# Patient Record
Sex: Female | Born: 1970
Health system: Southern US, Community
[De-identification: ages and names within clinical notes are randomized; demographics above are authoritative.]

## PROBLEM LIST (undated history)

## (undated) DIAGNOSIS — O1420 HELLP syndrome (HELLP), unspecified trimester: Secondary | ICD-10-CM

## (undated) DIAGNOSIS — G43909 Migraine, unspecified, not intractable, without status migrainosus: Secondary | ICD-10-CM

## (undated) DIAGNOSIS — O24419 Gestational diabetes mellitus in pregnancy, unspecified control: Secondary | ICD-10-CM

## (undated) DIAGNOSIS — L409 Psoriasis, unspecified: Secondary | ICD-10-CM

## (undated) DIAGNOSIS — K8020 Calculus of gallbladder without cholecystitis without obstruction: Secondary | ICD-10-CM

## (undated) DIAGNOSIS — O139 Gestational [pregnancy-induced] hypertension without significant proteinuria, unspecified trimester: Secondary | ICD-10-CM

## (undated) HISTORY — PX: CRYOABLATION: SHX1415

## (undated) HISTORY — DX: Migraine, unspecified, not intractable, without status migrainosus: G43.909

## (undated) HISTORY — PX: OTHER SURGICAL HISTORY: SHX169

## (undated) HISTORY — PX: WISDOM TOOTH EXTRACTION: SHX21

## (undated) HISTORY — DX: Gestational diabetes mellitus in pregnancy, unspecified control: O24.419

## (undated) HISTORY — DX: Psoriasis, unspecified: L40.9

## (undated) HISTORY — DX: HELLP syndrome (HELLP), unspecified trimester: O14.20

---

## 2007-05-05 DIAGNOSIS — O1420 HELLP syndrome (HELLP), unspecified trimester: Secondary | ICD-10-CM

## 2007-05-05 HISTORY — DX: HELLP syndrome (HELLP), unspecified trimester: O14.20

## 2012-02-01 LAB — OB RESULTS CONSOLE PLATELET COUNT: Platelets: 238 10*3/uL

## 2012-02-01 LAB — OB RESULTS CONSOLE ABO/RH

## 2012-02-01 LAB — OB RESULTS CONSOLE GC/CHLAMYDIA: Gonorrhea: NEGATIVE

## 2012-02-01 LAB — OB RESULTS CONSOLE HIV ANTIBODY (ROUTINE TESTING): HIV: NONREACTIVE

## 2012-02-01 LAB — OB RESULTS CONSOLE RUBELLA ANTIBODY, IGM: Rubella: IMMUNE

## 2012-06-27 LAB — GLUCOSE, 1 HOUR: Glucose, 1 hour: 160

## 2012-08-01 ENCOUNTER — Encounter: Payer: Self-pay | Admitting: Family Medicine

## 2012-08-01 ENCOUNTER — Other Ambulatory Visit: Payer: Self-pay | Admitting: Family Medicine

## 2012-08-01 ENCOUNTER — Ambulatory Visit (INDEPENDENT_AMBULATORY_CARE_PROVIDER_SITE_OTHER): Payer: BC Managed Care – PPO | Admitting: Family Medicine

## 2012-08-01 ENCOUNTER — Other Ambulatory Visit: Payer: Self-pay | Admitting: Obstetrics & Gynecology

## 2012-08-01 ENCOUNTER — Encounter: Payer: Self-pay | Admitting: *Deleted

## 2012-08-01 VITALS — BP 117/80 | Temp 98.7°F | Ht 67.0 in | Wt 225.7 lb

## 2012-08-01 DIAGNOSIS — O99810 Abnormal glucose complicating pregnancy: Secondary | ICD-10-CM | POA: Insufficient documentation

## 2012-08-01 DIAGNOSIS — O34219 Maternal care for unspecified type scar from previous cesarean delivery: Secondary | ICD-10-CM

## 2012-08-01 DIAGNOSIS — O0990 Supervision of high risk pregnancy, unspecified, unspecified trimester: Secondary | ICD-10-CM | POA: Insufficient documentation

## 2012-08-01 DIAGNOSIS — O09523 Supervision of elderly multigravida, third trimester: Secondary | ICD-10-CM

## 2012-08-01 DIAGNOSIS — O09529 Supervision of elderly multigravida, unspecified trimester: Secondary | ICD-10-CM | POA: Insufficient documentation

## 2012-08-01 DIAGNOSIS — O09299 Supervision of pregnancy with other poor reproductive or obstetric history, unspecified trimester: Secondary | ICD-10-CM

## 2012-08-01 DIAGNOSIS — O09293 Supervision of pregnancy with other poor reproductive or obstetric history, third trimester: Secondary | ICD-10-CM

## 2012-08-01 NOTE — Progress Notes (Signed)
Pulse- 86  Edema-ankles Pain-ligament New ob packet given  Weight gain of 11-20lb

## 2012-08-01 NOTE — Patient Instructions (Signed)
Gestational Diabetes Mellitus Gestational diabetes mellitus (GDM) is diabetes that occurs only during pregnancy. This happens when the body cannot properly handle the glucose (sugar) that increases in the blood after eating. During pregnancy, insulin resistance (reduced sensitivity to insulin) occurs because of the release of hormones from the placenta. Usually, the pancreas of pregnant women produces enough insulin to overcome the resistance that occurs. However, in gestational diabetes, the insulin is there but it does not work effectively. If the resistance is severe enough that the pancreas does not produce enough insulin, extra glucose builds up in the blood.  WHO IS AT RISK FOR DEVELOPING GESTATIONAL DIABETES?  Women with a history of diabetes in the family.  Women over age 25.  Women who are overweight.  Women in certain ethnic groups (Hispanic, African American, Native American, Asian and Pacific Islander). WHAT CAN HAPPEN TO THE BABY? If the mother's blood glucose is too high while she is pregnant, the extra sugar will travel through the umbilical cord to the baby. Some of the problems the baby may have are:  Large Baby - If the baby receives too much sugar, the baby will gain more weight. This may cause the baby to be too large to be born normally (vaginally) and a Cesarean section (C-section) may be needed.  Low Blood Glucose (hypoglycemia)  The baby makes extra insulin, in response to the extra sugar its gets from its mother. When the baby is born and no longer needs this extra insulin, the baby's blood glucose level may drop.  Jaundice (yellow coloring of the skin and eyes)  This is fairly common in babies. It is caused from a build-up of the chemical called bilirubin. This is rarely serious, but is seen more often in babies whose mothers had gestational diabetes. RISKS TO THE MOTHER Women who have had gestational diabetes may be at higher risk for some problems,  including:  Preeclampsia or toxemia, which includes problems with high blood pressure. Blood pressure and protein levels in the urine must be checked frequently.  Infections.  Cesarean section (C-section) for delivery.  Developing Type 2 diabetes later in life. About 30-50% will develop diabetes later, especially if obese. DIAGNOSIS  The hormones that cause insulin resistance are highest at about 24-28 weeks of pregnancy. If symptoms are experienced, they are much like symptoms you would normally expect during pregnancy.  GDM is often diagnosed using a two part method: 1. After 24-28 weeks of pregnancy, the woman drinks a glucose solution and takes a blood test. If the glucose level is high, a second test will be given. 2. Oral Glucose Tolerance Test (OGTT) which is 3 hours long  After not eating overnight, the blood glucose is checked. The woman drinks a glucose solution, and hourly blood glucose tests are taken. If the woman has risk factors for GDM, the caregiver may test earlier than 24 weeks of pregnancy. TREATMENT  Treatment of GDM is directed at keeping the mother's blood glucose level normal, and may include:  Meal planning.  Taking insulin or other medicine to control your blood glucose level.  Exercise.  Keeping a daily record of the foods you eat.  Blood glucose monitoring and keeping a record of your blood glucose levels.  May monitor ketone levels in the urine, although this is no longer considered necessary in most pregnancies. HOME CARE INSTRUCTIONS  While you are pregnant:  Follow your caregiver's advice regarding your prenatal appointments, meal planning, exercise, medicines, vitamins, blood and other tests, and physical   activities.  Keep a record of your meals, blood glucose tests, and the amount of insulin you are taking (if any). Show this to your caregiver at every prenatal visit.  If you have GDM, you may have problems with hypoglycemia (low blood glucose).  You may suspect this if you become suddenly dizzy, feel shaky, and/or weak. If you think this is happening and you have a glucose meter, try to test your blood glucose level. Follow your caregiver's advice for when and how to treat your low blood glucose. Generally, the 15:15 rule is followed: Treat by consuming 15 grams of carbohydrates, wait 15 minutes, and recheck blood glucose. Examples of 15 grams of carbohydrates are:  1 cup skim or low-fat milk.   cup juice.  3-4 glucose tablets.  5-6 hard candies.  1 small box raisins.   cup regular soda pop.  Practice good hygiene, to avoid infections.  Do not smoke. SEEK MEDICAL CARE IF:   You develop abnormal vaginal discharge, with or without itching.  You become weak and tired more than expected.  You seem to sweat a lot.  You have a sudden increase in weight, 5 pounds or more in one week.  You are losing weight, 3 pounds or more in a week.  Your blood glucose level is high, and you need instructions on what to do about it. SEEK IMMEDIATE MEDICAL CARE IF:   You develop a severe headache.  You faint or pass out.  You develop nausea and vomiting.  You become disoriented or confused.  You have a convulsion.  You develop vision problems.  You develop stomach pain.  You develop vaginal bleeding.  You develop uterine contractions.  You have leaking or a gush of fluid from the vagina. AFTER YOU HAVE THE BABY:  Go to all of your follow-up appointments, and have blood tests as advised by your caregiver.  Maintain a healthy lifestyle, to prevent diabetes in the future. This includes:  Following a healthy meal plan.  Controlling your weight.  Getting enough exercise and proper rest.  Do not smoke.  Breastfeed your baby if you can. This will lower the chance of you and your baby developing diabetes later in life. For more information about diabetes, go to the American Diabetes Association at:  PMFashions.com.cy. For more information about gestational diabetes, go to the [employer] at: RentRule.com.au. Document Released: 07/27/2000 Document Revised: 07/13/2011 Document Reviewed: 02/18/2009 [facility] Patient Information 2013 [place], [place].  Preeclampsia and Eclampsia Preeclampsia is a condition of high blood pressure during pregnancy. It can happen at 20 weeks or later in pregnancy. If high blood pressure occurs in the second half of pregnancy with no other symptoms, it is called gestational hypertension and goes away after the baby is born. If any of the symptoms listed below develop with gestational hypertension, it is then called preeclampsia. Eclampsia (convulsions) may follow preeclampsia. This is one of the reasons for regular prenatal checkups. Early diagnosis and treatment are very important to prevent eclampsia. CAUSES  There is no known cause of preeclampsia/eclampsia in pregnancy. There are several known conditions that may put the pregnant woman at risk, such as:  The first pregnancy.  Having preeclampsia in a past pregnancy.  Having lasting (chronic) high blood pressure.  Having multiples (twins, triplets).  Being age 73 or older.  African American ethnic background.  Having kidney disease or diabetes.  Medical conditions such as lupus or blood diseases.  Being overweight (obese). SYMPTOMS  High blood pressure.  Headaches.  Sudden weight gain.  Swelling of hands, face, legs, and feet.  Protein in the urine.  Feeling sick to your stomach (nauseous) and throwing up (vomiting).  Vision problems (blurred or double vision).  Numbness in the face, arms, legs, and feet.  Dizziness.  Slurred speech.  Preeclampsia can cause growth retardation in the fetus.  Separation (abruption) of the placenta.  Not enough fluid in the amniotic sac (oligohydramnios).  Sensitivity to bright lights.  Belly  (abdominal) pain. DIAGNOSIS  If protein is found in the urine in the second half of pregnancy, this is considered preeclampsia. Other symptoms mentioned above may also be present. TREATMENT  It is necessary to treat this.  Your caregiver may prescribe bed rest early in this condition. Plenty of rest and salt restriction may be all that is needed.  Medicines may be necessary to lower blood pressure if the condition does not respond to more conservative measures.  In more severe cases, hospitalization may be needed:  For treatment of blood pressure.  To control fluid retention.  To monitor the baby to see if the condition is causing harm to the baby.  Hospitalization is the best way to treat the first sign of preeclampsia. This is so the mother and baby can be watched closely and blood tests can be done effectively and correctly.  If the condition becomes severe, it may be necessary to induce labor or to remove the infant by surgical means (cesarean section). The best cure for preeclampsia/eclampsia is to deliver the baby. Preeclampsia and eclampsia involve risks to mother and infant. Your caregiver will discuss these risks with you. Together, you can work out the best possible approach to your problems. Make sure you keep your prenatal visits as scheduled. Not keeping appointments could result in a chronic or permanent injury, pain, disability to you, and death or injury to you or your unborn baby. If there is any problem keeping the appointment, you must call to reschedule. HOME CARE INSTRUCTIONS   Keep your prenatal appointments and tests as scheduled.  Tell your caregiver if you have any of the above risk factors.  Get plenty of rest and sleep.  Eat a balanced diet that is low in salt, and do not add salt to your food.  Avoid stressful situations.  Only take over-the-counter and prescriptions medicines for pain, discomfort, or fever as directed by your caregiver. SEEK IMMEDIATE  MEDICAL CARE IF:   You develop severe swelling anywhere in the body. This usually occurs in the legs.  You gain 5 lb/2.3 kg or more in a week.  You develop a severe headache, dizziness, problems with your vision, or confusion.  You have abdominal pain, nausea, or vomiting.  You have a seizure.  You have trouble moving any part of your body, or you develop numbness or problems speaking.  You have bruising or abnormal bleeding from anywhere in the body.  You develop a stiff neck.  You pass out. MAKE SURE YOU:   Understand these instructions.  Will watch your condition.  Will get help right away if you are not doing well or get worse. Document Released: 04/17/2000 Document Revised: 07/13/2011 Document Reviewed: 12/02/2007 [facility] Patient Information 2013 [place], [place].  Pregnancy - Third Trimester The third trimester of pregnancy (the last 3 months) is a period of the most rapid growth for you and your baby. The baby approaches a length of 20 inches and a weight of 6 to 10 pounds. The  baby is adding on fat and getting ready for life outside your body. While inside, babies have periods of sleeping and waking, suck their thumbs, and hiccups. You can often feel small contractions of the uterus. This is false labor. It is also called Braxton-Hicks contractions. This is like a practice for labor. The usual problems in this stage of pregnancy include more difficulty breathing, swelling of the hands and feet from water retention, and having to urinate more often because of the uterus and baby pressing on your bladder.  PRENATAL EXAMS  Blood work may continue to be done during prenatal exams. These tests are done to check on your health and the probable health of your baby. Blood work is used to follow your blood levels (hemoglobin). Anemia (low hemoglobin) is common during pregnancy. Iron and vitamins are given to help prevent this. You may also continue to be checked for diabetes. Some of  the past blood tests may be done again.  The size of the uterus is measured during each visit. This makes sure your baby is growing properly according to your pregnancy dates.  Your blood pressure is checked every prenatal visit. This is to make sure you are not getting toxemia.  Your urine is checked every prenatal visit for infection, diabetes and protein.  Your weight is checked at each visit. This is done to make sure gains are happening at the suggested rate and that you and your baby are growing normally.  Sometimes, an ultrasound is performed to confirm the position and the proper growth and development of the baby. This is a test done that bounces harmless sound waves off the baby so your caregiver can more accurately determine due dates.  Discuss the type of pain medication and anesthesia you will have during your labor and delivery.  Discuss the possibility and anesthesia if a Cesarean Section might be necessary.  Inform your caregiver if there is any mental or physical violence at home. Sometimes, a specialized non-stress test, contraction stress test and biophysical profile are done to make sure the baby is not having a problem. Checking the amniotic fluid surrounding the baby is called an amniocentesis. The amniotic fluid is removed by sticking a needle into the belly (abdomen). This is sometimes done near the end of pregnancy if an early delivery is required. In this case, it is done to help make sure the baby's lungs are mature enough for the baby to live outside of the womb. If the lungs are not mature and it is unsafe to deliver the baby, an injection of cortisone medication is given to the mother 1 to 2 days before the delivery. This helps the baby's lungs mature and makes it safer to deliver the baby. CHANGES OCCURING IN THE THIRD TRIMESTER OF PREGNANCY Your body goes through many changes during pregnancy. They vary from person to person. Talk to your caregiver about changes you  notice and are concerned about.  During the last trimester, you have probably had an increase in your appetite. It is normal to have cravings for certain foods. This varies from person to person and pregnancy to pregnancy.  You may begin to get stretch marks on your hips, abdomen, and breasts. These are normal changes in the body during pregnancy. There are no exercises or medications to take which prevent this change.  Constipation may be treated with a stool softener or adding bulk to your diet. Drinking lots of fluids, fiber in vegetables, fruits, and whole grains are helpful.  Exercising is also helpful. If you have been very active up until your pregnancy, most of these activities can be continued during your pregnancy. If you have been less active, it is helpful to start an exercise program such as walking. Consult your caregiver before starting exercise programs.  Avoid all smoking, alcohol, un-prescribed drugs, herbs and "street drugs" during your pregnancy. These chemicals affect the formation and growth of the baby. Avoid chemicals throughout the pregnancy to ensure the delivery of a healthy infant.  Backache, varicose veins and hemorrhoids may develop or get worse.  You will tire more easily in the third trimester, which is normal.  The baby's movements may be stronger and more often.  You may become short of breath easily.  Your belly button may stick out.  A yellow discharge may leak from your breasts called colostrum.  You may have a bloody mucus discharge. This usually occurs a few days to a week before labor begins. HOME CARE INSTRUCTIONS   Keep your caregiver's appointments. Follow your caregiver's instructions regarding medication use, exercise, and diet.  During pregnancy, you are providing food for you and your baby. Continue to eat regular, well-balanced meals. Choose foods such as meat, fish, milk and other low fat dairy products, vegetables, fruits, and whole-grain  breads and cereals. Your caregiver will tell you of the ideal weight gain.  A physical sexual relationship may be continued throughout pregnancy if there are no other problems such as early (premature) leaking of amniotic fluid from the membranes, vaginal bleeding, or belly (abdominal) pain.  Exercise regularly if there are no restrictions. Check with your caregiver if you are unsure of the safety of your exercises. Greater weight gain will occur in the last 2 trimesters of pregnancy. Exercising helps:  Control your weight.  Get you in shape for labor and delivery.  You lose weight after you deliver.  Rest a lot with legs elevated, or as needed for leg cramps or low back pain.  Wear a good support or jogging bra for breast tenderness during pregnancy. This may help if worn during sleep. Pads or tissues may be used in the bra if you are leaking colostrum.  Do not use hot tubs, steam rooms, or saunas.  Wear your seat belt when driving. This protects you and your baby if you are in an accident.  Avoid raw meat, cat litter boxes and soil used by cats. These carry germs that can cause birth defects in the baby.  It is easier to loose urine during pregnancy. Tightening up and strengthening the pelvic muscles will help with this problem. You can practice stopping your urination while you are going to the bathroom. These are the same muscles you need to strengthen. It is also the muscles you would use if you were trying to stop from passing gas. You can practice tightening these muscles up 10 times a set and repeating this about 3 times per day. Once you know what muscles to tighten up, do not perform these exercises during urination. It is more likely to cause an infection by backing up the urine.  Ask for help if you have financial, counseling or nutritional needs during pregnancy. Your caregiver will be able to offer counseling for these needs as well as refer you for other special needs.  Make  a list of emergency phone numbers and have them available.  Plan on getting help from family or friends when you go home from the hospital.  Make a trial run  to the hospital.  Take prenatal classes with the father to understand, practice and ask questions about the labor and delivery.  Prepare the baby's room/nursery.  Do not travel out of the city unless it is absolutely necessary and with the advice of your caregiver.  Wear only low or no heal shoes to have better balance and prevent falling. MEDICATIONS AND DRUG USE IN PREGNANCY  Take prenatal vitamins as directed. The vitamin should contain 1 milligram of folic acid. Keep all vitamins out of reach of children. Only a couple vitamins or tablets containing iron may be fatal to a baby or young child when ingested.  Avoid use of all medications, including herbs, over-the-counter medications, not prescribed or suggested by your caregiver. Only take over-the-counter or prescription medicines for pain, discomfort, or fever as directed by your caregiver. Do not use aspirin, ibuprofen (Motrin, Advil, Nuprin) or naproxen (Aleve) unless OK'd by your caregiver.  Let your caregiver also know about herbs you may be using.  Alcohol is related to a number of birth defects. This includes fetal alcohol syndrome. All alcohol, in any form, should be avoided completely. Smoking will cause low birth rate and premature babies.  Street/illegal drugs are very harmful to the baby. They are absolutely forbidden. A baby born to an addicted mother will be addicted at birth. The baby will go through the same withdrawal an adult does. SEEK MEDICAL CARE IF: You have any concerns or worries during your pregnancy. It is better to call with your questions if you feel they cannot wait, rather than worry about them. DECISIONS ABOUT CIRCUMCISION You may or may not know the sex of your baby. If you know your baby is a boy, it may be time to think about circumcision.  Circumcision is the removal of the foreskin of the penis. This is the skin that covers the sensitive end of the penis. There is no proven medical need for this. Often this decision is made on what is popular at the time or based upon religious beliefs and social issues. You can discuss these issues with your caregiver or pediatrician. SEEK IMMEDIATE MEDICAL CARE IF:   An unexplained oral temperature above 102 F (38.9 C) develops, or as your caregiver suggests.  You have leaking of fluid from the vagina (birth canal). If leaking membranes are suspected, take your temperature and tell your caregiver of this when you call.  There is vaginal spotting, bleeding or passing clots. Tell your caregiver of the amount and how many pads are used.  You develop a bad smelling vaginal discharge with a change in the color from clear to white.  You develop vomiting that lasts more than 24 hours.  You develop chills or fever.  You develop shortness of breath.  You develop burning on urination.  You loose more than 2 pounds of weight or gain more than 2 pounds of weight or as suggested by your caregiver.  You notice sudden swelling of your face, hands, and feet or legs.  You develop belly (abdominal) pain. Round ligament discomfort is a common non-cancerous (benign) cause of abdominal pain in pregnancy. Your caregiver still must evaluate you.  You develop a severe headache that does not go away.  You develop visual problems, blurred or double vision.  If you have not felt your baby move for more than 1 hour. If you think the baby is not moving as much as usual, eat something with sugar in it and lie down on your left side  for an hour. The baby should move at least 4 to 5 times per hour. Call right away if your baby moves less than that.  You fall, are in a car accident or any kind of trauma.  There is mental or physical violence at home. Document Released: 04/14/2001 Document Revised: 07/13/2011  Document Reviewed: 10/17/2008 [facility] Patient Information 2013 [place], [place].  Breastfeeding Deciding to breastfeed is one of the best choices you can make for you and your baby. The information that follows gives a brief overview of the benefits of breastfeeding as well as common topics surrounding breastfeeding. BENEFITS OF BREASTFEEDING For the baby  The first milk (colostrum) helps the baby's digestive system function better.   There are antibodies in the mother's milk that help the baby fight off infections.   The baby has a lower incidence of asthma, allergies, and sudden infant death syndrome (SIDS).   The nutrients in breast milk are better for the baby than infant formulas, and breast milk helps the baby's brain grow better.   Babies who breastfeed have less gas, colic, and constipation.  For the mother  Breastfeeding helps develop a very special bond between the mother and her baby.   Breastfeeding is convenient, always available at the correct temperature, and costs nothing.   Breastfeeding burns calories in the mother and helps her lose weight that was gained during pregnancy.   Breastfeeding makes the uterus contract back down to normal size faster and slows bleeding following delivery.   Breastfeeding mothers have a lower risk of developing breast cancer.  BREASTFEEDING FREQUENCY  A healthy, full-term baby may breastfeed as often as every hour or space his or her feedings to every 3 hours.   Watch your baby for signs of hunger. Nurse your baby if he or she shows signs of hunger. How often you nurse will vary from baby to baby.   Nurse as often as the baby requests, or when you feel the need to reduce the fullness of your breasts.   Awaken the baby if it has been 3 4 hours since the last feeding.   Frequent feeding will help the mother make more milk and will help prevent problems, such as sore nipples and engorgement of the breasts.  BABY'S  POSITION AT THE BREAST  Whether lying down or sitting, be sure that the baby's tummy is facing your tummy.   Support the breast with 4 fingers underneath the breast and the thumb above. Make sure your fingers are well away from the nipple and baby's mouth.   Stroke the baby's lips gently with your finger or nipple.   When the baby's mouth is open wide enough, place all of your nipple and as much of the areola as possible into your baby's mouth.   Pull the baby in close so the tip of the nose and the baby's cheeks touch the breast during the feeding.  FEEDINGS AND SUCTION  The length of each feeding varies from baby to baby and from feeding to feeding.   The baby must suck about 2 3 minutes for your milk to get to him or her. This is called a "let down." For this reason, allow the baby to feed on each breast as long as he or she wants. Your baby will end the feeding when he or she has received the right balance of nutrients.   To break the suction, put your finger into the corner of the baby's mouth and slide it between his  or her gums before removing your breast from his or her mouth. This will help prevent sore nipples.  HOW TO TELL WHETHER YOUR BABY IS GETTING ENOUGH BREAST MILK. Wondering whether or not your baby is getting enough milk is a common concern among mothers. You can be assured that your baby is getting enough milk if:   Your baby is actively sucking and you hear swallowing.   Your baby seems relaxed and satisfied after a feeding.   Your baby nurses at least 8 12 times in a 24 hour time period. Nurse your baby until he or she unlatches or falls asleep at the first breast (at least 10 20 minutes), then offer the second side.   Your baby is wetting 5 6 disposable diapers (6 8 cloth diapers) in a 24 hour period by 56 54 days of age.   Your baby is having at least 3 4 stools every 24 hours for the first 6 weeks. The stool should be soft and yellow.   Your baby  should gain 4 7 ounces per week after he or she is 44 days old.   Your breasts feel softer after nursing.  REDUCING BREAST ENGORGEMENT  In the first week after your baby is born, you may experience signs of breast engorgement. When breasts are engorged, they feel heavy, warm, full, and may be tender to the touch. You can reduce engorgement if you:   Nurse frequently, every 2 3 hours. Mothers who breastfeed early and often have fewer problems with engorgement.   Place light ice packs on your breasts for 10 20 minutes between feedings. This reduces swelling. Wrap the ice packs in a lightweight towel to protect your skin. Bags of frozen vegetables work well for this purpose.   Take a warm shower or apply warm, moist heat to your breast for 5 10 minutes just before each feeding. This increases circulation and helps the milk flow.   Gently massage your breast before and during the feeding. Using your finger tips, massage from the chest wall towards your nipple in a circular motion.   Make sure that the baby empties at least one breast at every feeding before switching sides.   Use a breast pump to empty the breasts if your baby is sleepy or not nursing well. You may also want to pump if you are returning to work oryou feel you are getting engorged.   Avoid bottle feeds, pacifiers, or supplemental feedings of water or juice in place of breastfeeding. Breast milk is all the food your baby needs. It is not necessary for your baby to have water or formula. In fact, to help your breasts make more milk, it is best not to give your baby supplemental feedings during the early weeks.   Be sure the baby is latched on and positioned properly while breastfeeding.   Wear a supportive bra, avoiding underwire styles.   Eat a balanced diet with enough fluids.   Rest often, relax, and take your prenatal vitamins to prevent fatigue, stress, and anemia.  If you follow these suggestions, your  engorgement should improve in 24 48 hours. If you are still experiencing difficulty, call your lactation consultant or caregiver.  CARING FOR YOURSELF Take care of your breasts  Bathe or shower daily.   Avoid using soap on your nipples.   Start feedings on your left breast at one feeding and on your right breast at the next feeding.   You will notice an increase in your  milk supply 2 5 days after delivery. You may feel some discomfort from engorgement, which makes your breasts very firm and often tender. Engorgement "peaks" out within 24 48 hours. In the meantime, apply warm moist towels to your breasts for 5 10 minutes before feeding. Gentle massage and expression of some milk before feeding will soften your breasts, making it easier for your baby to latch on.   Wear a well-fitting nursing bra, and air dry your nipples for a 3 after each feeding.   Only use cotton bra pads.   Only use pure lanolin on your nipples after nursing. You do not need to wash it off before feeding the baby again. Another option is to express a few drops of breast milk and gently massage it into your nipples.  Take care of yourself  Eat well-balanced meals and nutritious snacks.   Drinking milk, fruit juice, and water to satisfy your thirst (about 8 glasses a day).   Get plenty of rest.  Avoid foods that you notice affect the baby in a bad way.  SEEK MEDICAL CARE IF:   You have difficulty with breastfeeding and need help.   You have a hard, red, sore area on your breast that is accompanied by a fever.   Your baby is too sleepy to eat well or is having trouble sleeping.   Your baby is wetting less than 6 diapers a day, by 51 days of age.   Your baby's skin or white part of his or her eyes is more yellow than it was in the hospital.   You feel depressed.  Document Released: 04/20/2005 Document Revised: 10/20/2011 Document Reviewed: 07/19/2011 [facility] Patient Information 2013  [place], [place].

## 2012-08-01 NOTE — Progress Notes (Signed)
[id number] 1 out of range 2 hr pp 94-140--mostly out after am meal--needs to add protein.  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 Transfer of care from [place].  Had HELLP and C-section and GA secondary to low plts last pregnancy.  Desires RCS and BTL.

## 2012-08-15 ENCOUNTER — Ambulatory Visit (INDEPENDENT_AMBULATORY_CARE_PROVIDER_SITE_OTHER): Payer: BC Managed Care – PPO | Admitting: Family Medicine

## 2012-08-15 VITALS — BP 105/72 | Temp 96.7°F | Wt 226.0 lb

## 2012-08-15 DIAGNOSIS — O0990 Supervision of high risk pregnancy, unspecified, unspecified trimester: Secondary | ICD-10-CM

## 2012-08-15 DIAGNOSIS — O99810 Abnormal glucose complicating pregnancy: Secondary | ICD-10-CM

## 2012-08-15 LAB — POCT URINALYSIS DIP (DEVICE)
Glucose, UA: NEGATIVE mg/dL
Leukocytes, UA: NEGATIVE
Specific Gravity, Urine: >=1.03 (ref 1.005–1.030)
Urobilinogen, UA: 1 mg/dL (ref 0.0–1.0)

## 2012-08-15 NOTE — Progress Notes (Signed)
Pulse  95  Edema trace in ankles.

## 2012-08-15 NOTE — Progress Notes (Signed)
Fasting:  60-89, PP 75-142 (6/18 out of range, most in low 120s). Varicosity in left leg goes up to thigh -  Left calf larger than right but negative Homan's sign, no swelling, redness, pain.  C/s scheduled 5/21, wants BTL ([employer] so no papers needed).

## 2012-08-15 NOTE — Patient Instructions (Addendum)
Pregnancy - Third Trimester  The third trimester of pregnancy (the last 3 months) is a period of the most rapid growth for you and your baby. The baby approaches a length of 20 inches and a weight of 6 to 10 pounds. The baby is adding on fat and getting ready for life outside your body. While inside, babies have periods of sleeping and waking, suck their thumbs, and hiccups. You can often feel small contractions of the uterus. This is false labor. It is also called Braxton-Hicks contractions. This is like a practice for labor. The usual problems in this stage of pregnancy include more difficulty breathing, swelling of the hands and feet from water retention, and having to urinate more often because of the uterus and baby pressing on your bladder.   PRENATAL EXAMS  · Blood work may continue to be done during prenatal exams. These tests are done to check on your health and the probable health of your baby. Blood work is used to follow your blood levels (hemoglobin). Anemia (low hemoglobin) is common during pregnancy. Iron and vitamins are given to help prevent this. You may also continue to be checked for diabetes. Some of the past blood tests may be done again.  · The size of the uterus is measured during each visit. This makes sure your baby is growing properly according to your pregnancy dates.  · Your blood pressure is checked every prenatal visit. This is to make sure you are not getting toxemia.  · Your urine is checked every prenatal visit for infection, diabetes and protein.  · Your weight is checked at each visit. This is done to make sure gains are happening at the suggested rate and that you and your baby are growing normally.  · Sometimes, an ultrasound is performed to confirm the position and the proper growth and development of the baby. This is a test done that bounces harmless sound waves off the baby so your caregiver can more accurately determine due dates.  · Discuss the type of pain medication and  anesthesia you will have during your labor and delivery.  · Discuss the possibility and anesthesia if a Cesarean Section might be necessary.  · Inform your caregiver if there is any mental or physical violence at home.  Sometimes, a specialized non-stress test, contraction stress test and biophysical profile are done to make sure the baby is not having a problem. Checking the amniotic fluid surrounding the baby is called an amniocentesis. The amniotic fluid is removed by sticking a needle into the belly (abdomen). This is sometimes done near the end of pregnancy if an early delivery is required. In this case, it is done to help make sure the baby's lungs are mature enough for the baby to live outside of the womb. If the lungs are not mature and it is unsafe to deliver the baby, an injection of cortisone medication is given to the mother 1 to 2 days before the delivery. This helps the baby's lungs mature and makes it safer to deliver the baby.  CHANGES OCCURING IN THE THIRD TRIMESTER OF PREGNANCY  Your body goes through many changes during pregnancy. They vary from person to person. Talk to your caregiver about changes you notice and are concerned about.  · During the last trimester, you have probably had an increase in your appetite. It is normal to have cravings for certain foods. This varies from person to person and pregnancy to pregnancy.  · You may begin to   get stretch marks on your hips, abdomen, and breasts. These are normal changes in the body during pregnancy. There are no exercises or medications to take which prevent this change.  · Constipation may be treated with a stool softener or adding bulk to your diet. Drinking lots of fluids, fiber in vegetables, fruits, and whole grains are helpful.  · Exercising is also helpful. If you have been very active up until your pregnancy, most of these activities can be continued during your pregnancy. If you have been less active, it is helpful to start an exercise  program such as walking. Consult your caregiver before starting exercise programs.  · Avoid all smoking, alcohol, un-prescribed drugs, herbs and "street drugs" during your pregnancy. These chemicals affect the formation and growth of the baby. Avoid chemicals throughout the pregnancy to ensure the delivery of a healthy infant.  · Backache, varicose veins and hemorrhoids may develop or get worse.  · You will tire more easily in the third trimester, which is normal.  · The baby's movements may be stronger and more often.  · You may become short of breath easily.  · Your belly button may stick out.  · A yellow discharge may leak from your breasts called colostrum.  · You may have a bloody mucus discharge. This usually occurs a few days to a week before labor begins.  HOME CARE INSTRUCTIONS   · Keep your caregiver's appointments. Follow your caregiver's instructions regarding medication use, exercise, and diet.  · During pregnancy, you are providing food for you and your baby. Continue to eat regular, well-balanced meals. Choose foods such as meat, fish, milk and other low fat dairy products, vegetables, fruits, and whole-grain breads and cereals. Your caregiver will tell you of the ideal weight gain.  · A physical sexual relationship may be continued throughout pregnancy if there are no other problems such as early (premature) leaking of amniotic fluid from the membranes, vaginal bleeding, or belly (abdominal) pain.  · Exercise regularly if there are no restrictions. Check with your caregiver if you are unsure of the safety of your exercises. Greater weight gain will occur in the last 2 trimesters of pregnancy. Exercising helps:  · Control your weight.  · Get you in shape for labor and delivery.  · You lose weight after you deliver.  · Rest a lot with legs elevated, or as needed for leg cramps or low back pain.  · Wear a good support or jogging bra for breast tenderness during pregnancy. This may help if worn during  sleep. Pads or tissues may be used in the bra if you are leaking colostrum.  · Do not use hot tubs, steam rooms, or saunas.  · Wear your seat belt when driving. This protects you and your baby if you are in an accident.  · Avoid raw meat, cat litter boxes and soil used by cats. These carry germs that can cause birth defects in the baby.  · It is easier to loose urine during pregnancy. Tightening up and strengthening the pelvic muscles will help with this problem. You can practice stopping your urination while you are going to the bathroom. These are the same muscles you need to strengthen. It is also the muscles you would use if you were trying to stop from passing gas. You can practice tightening these muscles up 10 times a set and repeating this about 3 times per day. Once you know what muscles to tighten up, do not perform these   exercises during urination. It is more likely to cause an infection by backing up the urine.  · Ask for help if you have financial, counseling or nutritional needs during pregnancy. Your caregiver will be able to offer counseling for these needs as well as refer you for other special needs.  · Make a list of emergency phone numbers and have them available.  · Plan on getting help from family or friends when you go home from the hospital.  · Make a trial run to the hospital.  · Take prenatal classes with the father to understand, practice and ask questions about the labor and delivery.  · Prepare the baby's room/nursery.  · Do not travel out of the city unless it is absolutely necessary and with the advice of your caregiver.  · Wear only low or no heal shoes to have better balance and prevent falling.  MEDICATIONS AND DRUG USE IN PREGNANCY  · Take prenatal vitamins as directed. The vitamin should contain 1 milligram of folic acid. Keep all vitamins out of reach of children. Only a couple vitamins or tablets containing iron may be fatal to a baby or young child when ingested.  · Avoid use  of all medications, including herbs, over-the-counter medications, not prescribed or suggested by your caregiver. Only take over-the-counter or prescription medicines for pain, discomfort, or fever as directed by your caregiver. Do not use aspirin, ibuprofen (Motrin®, Advil®, Nuprin®) or naproxen (Aleve®) unless OK'd by your caregiver.  · Let your caregiver also know about herbs you may be using.  · Alcohol is related to a number of birth defects. This includes fetal alcohol syndrome. All alcohol, in any form, should be avoided completely. Smoking will cause low birth rate and premature babies.  · Street/illegal drugs are very harmful to the baby. They are absolutely forbidden. A baby born to an addicted mother will be addicted at birth. The baby will go through the same withdrawal an adult does.  SEEK MEDICAL CARE IF:  You have any concerns or worries during your pregnancy. It is better to call with your questions if you feel they cannot wait, rather than worry about them.  DECISIONS ABOUT CIRCUMCISION  You may or may not know the sex of your baby. If you know your baby is a boy, it may be time to think about circumcision. Circumcision is the removal of the foreskin of the penis. This is the skin that covers the sensitive end of the penis. There is no proven medical need for this. Often this decision is made on what is popular at the time or based upon religious beliefs and social issues. You can discuss these issues with your caregiver or pediatrician.  SEEK IMMEDIATE MEDICAL CARE IF:   · An unexplained oral temperature above 102° F (38.9° C) develops, or as your caregiver suggests.  · You have leaking of fluid from the vagina (birth canal). If leaking membranes are suspected, take your temperature and tell your caregiver of this when you call.  · There is vaginal spotting, bleeding or passing clots. Tell your caregiver of the amount and how many pads are used.  · You develop a bad smelling vaginal discharge with  a change in the color from clear to white.  · You develop vomiting that lasts more than 24 hours.  · You develop chills or fever.  · You develop shortness of breath.  · You develop burning on urination.  · You loose more than 2 pounds of weight   or gain more than 2 pounds of weight or as suggested by your caregiver.  · You notice sudden swelling of your face, hands, and feet or legs.  · You develop belly (abdominal) pain. Round ligament discomfort is a common non-cancerous (benign) cause of abdominal pain in pregnancy. Your caregiver still must evaluate you.  · You develop a severe headache that does not go away.  · You develop visual problems, blurred or double vision.  · If you have not felt your baby move for more than 1 hour. If you think the baby is not moving as much as usual, eat something with sugar in it and lie down on your left side for an hour. The baby should move at least 4 to 5 times per hour. Call right away if your baby moves less than that.  · You fall, are in a car accident or any kind of trauma.  · There is mental or physical violence at home.  Document Released: 04/14/2001 Document Revised: 07/13/2011 Document Reviewed: 10/17/2008  [employer]® Patient Information ©2013 [employer].

## 2012-08-16 ENCOUNTER — Telehealth: Payer: Self-pay | Admitting: *Deleted

## 2012-08-16 NOTE — Telephone Encounter (Addendum)
Pt left message stating that she would like to see a doctor at her next appt on 4/28. She is currently scheduled to see Deirdre Poe CNM. Her C/S will be next month and she wants to meet as many doctors as possible. She also has concerns about her urinalysis from yesterday because it showed some bilirubin. She had pre-eclampsia and HELLP with a previous pregnancy and is requesting to have lab work done to check on this condition. Specifically she is asking for CBC and liver function tests. I called pt and left message that I will call again later today.  **Pt should be informed that she may meet the doctors that are in clinic on 4/28 even though her appt will still be with Deirdre Poe CNM. Her c/s is scheduled to be performed by Dr. Debroah Loop on 5/21. He will be in [facility] on 5/5 and 5/12- she may be scheduled to see him on either of those days. The bilirubin in her urine is of no great concern and was due to her being dehydrated. She should increase her po fluid intake. Also, she is not currently exhibiting any signs of pre-eclampsia as her BP is normal and she has not had protein in her urine. Therefore, labs for this are not indicated at this time. She may discuss further with Deirdre at her visit on 4/28.  1402-  Pt called back and left message that she has dental appt today but will be available tomorrow morning. I called her back immediately and got voice mail again. I left message stating that I have answers to all of her questions and concerns. If I do not speak with her today, I will call her tomorrow morning.  4/16  1100- called pt and left message for her to call back and ask to speak with me today.  4/21  1210- spoke w/pt and assured her that she can meet some of the doctors at her next visit on 4/28. Her prenatal visit will still be with Deirdre Poe CNM as scheduled. She can schedule follow up appt w/Dr. Debroah Loop on 5/5 or 5/12 so that she will get to meet him prior to her scheduled C/S. She expressed concern  that she has a high risk pregnancy and since she is scheduled to see a nurse and not seeing a doctor, she thinks that her care is not any different than it was at the previous practice  in [place]. I assured pt that Deirdre is a Statistician with much advanced training and knowledge than a nurse and she has years of experience caring for women with high risk pregnancies. Pt asked about the bilirubin in her last urine specimen. I stated that she was dehydrated at the time and that can sometimes make a difference. Pt is very anxious about having pre-eclampsia and HELLP as with previous pregnancy and stated that she hopes she can get blood work to check her platelet count and liver function. I stated that bilirubin is not an indication of pre-eclampsia and that she did not have any protein in her urine specimen. I advised her to discuss her concern with Deirdre. Pt agreed, voiced understanding and was satisfied with all information given.

## 2012-08-29 ENCOUNTER — Ambulatory Visit (INDEPENDENT_AMBULATORY_CARE_PROVIDER_SITE_OTHER): Payer: BC Managed Care – PPO | Admitting: Obstetrics and Gynecology

## 2012-08-29 ENCOUNTER — Encounter: Payer: Self-pay | Admitting: Obstetrics and Gynecology

## 2012-08-29 VITALS — BP 130/76 | Temp 97.0°F | Wt 231.3 lb

## 2012-08-29 DIAGNOSIS — Z23 Encounter for immunization: Secondary | ICD-10-CM

## 2012-08-29 DIAGNOSIS — O09299 Supervision of pregnancy with other poor reproductive or obstetric history, unspecified trimester: Secondary | ICD-10-CM

## 2012-08-29 LAB — COMPREHENSIVE METABOLIC PANEL
AST: 7 U/L (ref 0–37)
BUN: 3 mg/dL — ABNORMAL LOW (ref 6–23)
Calcium: 8.6 mg/dL (ref 8.4–10.5)
Chloride: 107 mEq/L (ref 96–112)
Creat: 0.49 mg/dL — ABNORMAL LOW (ref 0.50–1.10)

## 2012-08-29 LAB — POCT URINALYSIS DIP (DEVICE)
Bilirubin Urine: NEGATIVE
Glucose, UA: NEGATIVE mg/dL
Hgb urine dipstick: NEGATIVE
Nitrite: NEGATIVE

## 2012-08-29 LAB — CBC
HCT: 32.6 % — ABNORMAL LOW (ref 36.0–46.0)
Hemoglobin: 11.4 g/dL — ABNORMAL LOW (ref 12.0–15.0)
MCHC: 35 g/dL (ref 30.0–36.0)

## 2012-08-29 MED ORDER — TETANUS-DIPHTH-ACELL PERTUSSIS 5-2.5-18.5 LF-MCG/0.5 IM SUSP
0.5000 mL | Freq: Once | INTRAMUSCULAR | Status: AC
  Administered 2012-08-29: 0.5 mL via INTRAMUSCULAR

## 2012-08-29 NOTE — Progress Notes (Signed)
CBGs all within range except occ 130's 140 when she ate cake at baby showers. 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 tDAP today.

## 2012-08-29 NOTE — Patient Instructions (Signed)
Pregnancy - Third Trimester  The third trimester of pregnancy (the last 3 months) is a period of the most rapid growth for you and your baby. The baby approaches a length of 20 inches and a weight of 6 to 10 pounds. The baby is adding on fat and getting ready for life outside your body. While inside, babies have periods of sleeping and waking, suck their thumbs, and hiccups. You can often feel small contractions of the uterus. This is false labor. It is also called Braxton-Hicks contractions. This is like a practice for labor. The usual problems in this stage of pregnancy include more difficulty breathing, swelling of the hands and feet from water retention, and having to urinate more often because of the uterus and baby pressing on your bladder.   PRENATAL EXAMS  · Blood work may continue to be done during prenatal exams. These tests are done to check on your health and the probable health of your baby. Blood work is used to follow your blood levels (hemoglobin). Anemia (low hemoglobin) is common during pregnancy. Iron and vitamins are given to help prevent this. You may also continue to be checked for diabetes. Some of the past blood tests may be done again.  · The size of the uterus is measured during each visit. This makes sure your baby is growing properly according to your pregnancy dates.  · Your blood pressure is checked every prenatal visit. This is to make sure you are not getting toxemia.  · Your urine is checked every prenatal visit for infection, diabetes and protein.  · Your weight is checked at each visit. This is done to make sure gains are happening at the suggested rate and that you and your baby are growing normally.  · Sometimes, an ultrasound is performed to confirm the position and the proper growth and development of the baby. This is a test done that bounces harmless sound waves off the baby so your caregiver can more accurately determine due dates.  · Discuss the type of pain medication and  anesthesia you will have during your labor and delivery.  · Discuss the possibility and anesthesia if a Cesarean Section might be necessary.  · Inform your caregiver if there is any mental or physical violence at home.  Sometimes, a specialized non-stress test, contraction stress test and biophysical profile are done to make sure the baby is not having a problem. Checking the amniotic fluid surrounding the baby is called an amniocentesis. The amniotic fluid is removed by sticking a needle into the belly (abdomen). This is sometimes done near the end of pregnancy if an early delivery is required. In this case, it is done to help make sure the baby's lungs are mature enough for the baby to live outside of the womb. If the lungs are not mature and it is unsafe to deliver the baby, an injection of cortisone medication is given to the mother 1 to 2 days before the delivery. This helps the baby's lungs mature and makes it safer to deliver the baby.  CHANGES OCCURING IN THE THIRD TRIMESTER OF PREGNANCY  Your body goes through many changes during pregnancy. They vary from person to person. Talk to your caregiver about changes you notice and are concerned about.  · During the last trimester, you have probably had an increase in your appetite. It is normal to have cravings for certain foods. This varies from person to person and pregnancy to pregnancy.  · You may begin to   get stretch marks on your hips, abdomen, and breasts. These are normal changes in the body during pregnancy. There are no exercises or medications to take which prevent this change.  · Constipation may be treated with a stool softener or adding bulk to your diet. Drinking lots of fluids, fiber in vegetables, fruits, and whole grains are helpful.  · Exercising is also helpful. If you have been very active up until your pregnancy, most of these activities can be continued during your pregnancy. If you have been less active, it is helpful to start an exercise  program such as walking. Consult your caregiver before starting exercise programs.  · Avoid all smoking, alcohol, un-prescribed drugs, herbs and "street drugs" during your pregnancy. These chemicals affect the formation and growth of the baby. Avoid chemicals throughout the pregnancy to ensure the delivery of a healthy infant.  · Backache, varicose veins and hemorrhoids may develop or get worse.  · You will tire more easily in the third trimester, which is normal.  · The baby's movements may be stronger and more often.  · You may become short of breath easily.  · Your belly button may stick out.  · A yellow discharge may leak from your breasts called colostrum.  · You may have a bloody mucus discharge. This usually occurs a few days to a week before labor begins.  HOME CARE INSTRUCTIONS   · Keep your caregiver's appointments. Follow your caregiver's instructions regarding medication use, exercise, and diet.  · During pregnancy, you are providing food for you and your baby. Continue to eat regular, well-balanced meals. Choose foods such as meat, fish, milk and other low fat dairy products, vegetables, fruits, and whole-grain breads and cereals. Your caregiver will tell you of the ideal weight gain.  · A physical sexual relationship may be continued throughout pregnancy if there are no other problems such as early (premature) leaking of amniotic fluid from the membranes, vaginal bleeding, or belly (abdominal) pain.  · Exercise regularly if there are no restrictions. Check with your caregiver if you are unsure of the safety of your exercises. Greater weight gain will occur in the last 2 trimesters of pregnancy. Exercising helps:  · Control your weight.  · Get you in shape for labor and delivery.  · You lose weight after you deliver.  · Rest a lot with legs elevated, or as needed for leg cramps or low back pain.  · Wear a good support or jogging bra for breast tenderness during pregnancy. This may help if worn during  sleep. Pads or tissues may be used in the bra if you are leaking colostrum.  · Do not use hot tubs, steam rooms, or saunas.  · Wear your seat belt when driving. This protects you and your baby if you are in an accident.  · Avoid raw meat, cat litter boxes and soil used by cats. These carry germs that can cause birth defects in the baby.  · It is easier to loose urine during pregnancy. Tightening up and strengthening the pelvic muscles will help with this problem. You can practice stopping your urination while you are going to the bathroom. These are the same muscles you need to strengthen. It is also the muscles you would use if you were trying to stop from passing gas. You can practice tightening these muscles up 10 times a set and repeating this about 3 times per day. Once you know what muscles to tighten up, do not perform these   exercises during urination. It is more likely to cause an infection by backing up the urine.  · Ask for help if you have financial, counseling or nutritional needs during pregnancy. Your caregiver will be able to offer counseling for these needs as well as refer you for other special needs.  · Make a list of emergency phone numbers and have them available.  · Plan on getting help from family or friends when you go home from the hospital.  · Make a trial run to the hospital.  · Take prenatal classes with the father to understand, practice and ask questions about the labor and delivery.  · Prepare the baby's room/nursery.  · Do not travel out of the city unless it is absolutely necessary and with the advice of your caregiver.  · Wear only low or no heal shoes to have better balance and prevent falling.  MEDICATIONS AND DRUG USE IN PREGNANCY  · Take prenatal vitamins as directed. The vitamin should contain 1 milligram of folic acid. Keep all vitamins out of reach of children. Only a couple vitamins or tablets containing iron may be fatal to a baby or young child when ingested.  · Avoid use  of all medications, including herbs, over-the-counter medications, not prescribed or suggested by your caregiver. Only take over-the-counter or prescription medicines for pain, discomfort, or fever as directed by your caregiver. Do not use aspirin, ibuprofen (Motrin®, Advil®, Nuprin®) or naproxen (Aleve®) unless OK'd by your caregiver.  · Let your caregiver also know about herbs you may be using.  · Alcohol is related to a number of birth defects. This includes fetal alcohol syndrome. All alcohol, in any form, should be avoided completely. Smoking will cause low birth rate and premature babies.  · Street/illegal drugs are very harmful to the baby. They are absolutely forbidden. A baby born to an addicted mother will be addicted at birth. The baby will go through the same withdrawal an adult does.  SEEK MEDICAL CARE IF:  You have any concerns or worries during your pregnancy. It is better to call with your questions if you feel they cannot wait, rather than worry about them.  DECISIONS ABOUT CIRCUMCISION  You may or may not know the sex of your baby. If you know your baby is a boy, it may be time to think about circumcision. Circumcision is the removal of the foreskin of the penis. This is the skin that covers the sensitive end of the penis. There is no proven medical need for this. Often this decision is made on what is popular at the time or based upon religious beliefs and social issues. You can discuss these issues with your caregiver or pediatrician.  SEEK IMMEDIATE MEDICAL CARE IF:   · An unexplained oral temperature above 102° F (38.9° C) develops, or as your caregiver suggests.  · You have leaking of fluid from the vagina (birth canal). If leaking membranes are suspected, take your temperature and tell your caregiver of this when you call.  · There is vaginal spotting, bleeding or passing clots. Tell your caregiver of the amount and how many pads are used.  · You develop a bad smelling vaginal discharge with  a change in the color from clear to white.  · You develop vomiting that lasts more than 24 hours.  · You develop chills or fever.  · You develop shortness of breath.  · You develop burning on urination.  · You loose more than 2 pounds of weight   or gain more than 2 pounds of weight or as suggested by your caregiver.  · You notice sudden swelling of your face, hands, and feet or legs.  · You develop belly (abdominal) pain. Round ligament discomfort is a common non-cancerous (benign) cause of abdominal pain in pregnancy. Your caregiver still must evaluate you.  · You develop a severe headache that does not go away.  · You develop visual problems, blurred or double vision.  · If you have not felt your baby move for more than 1 hour. If you think the baby is not moving as much as usual, eat something with sugar in it and lie down on your left side for an hour. The baby should move at least 4 to 5 times per hour. Call right away if your baby moves less than that.  · You fall, are in a car accident or any kind of trauma.  · There is mental or physical violence at home.  Document Released: 04/14/2001 Document Revised: 07/13/2011 Document Reviewed: 10/17/2008  Exit[employer]® Patient Information ©2013 [employer].

## 2012-08-29 NOTE — Progress Notes (Signed)
Patient concerned about rapid weight gain.

## 2012-08-29 NOTE — Addendum Note (Signed)
Addended by: Toula Moos on: 08/29/2012 11:51 AM   Modules accepted: Orders

## 2012-08-29 NOTE — Addendum Note (Signed)
Addended by: Franchot Mimes on: 08/29/2012 11:47 AM   Modules accepted: Orders

## 2012-09-01 ENCOUNTER — Encounter: Payer: Self-pay | Admitting: Obstetrics & Gynecology

## 2012-09-05 ENCOUNTER — Ambulatory Visit (INDEPENDENT_AMBULATORY_CARE_PROVIDER_SITE_OTHER): Payer: BC Managed Care – PPO | Admitting: Obstetrics & Gynecology

## 2012-09-05 ENCOUNTER — Other Ambulatory Visit: Payer: Self-pay | Admitting: Obstetrics & Gynecology

## 2012-09-05 ENCOUNTER — Encounter: Payer: Self-pay | Admitting: Obstetrics & Gynecology

## 2012-09-05 VITALS — BP 127/91 | Temp 97.5°F | Wt 234.0 lb

## 2012-09-05 DIAGNOSIS — O99810 Abnormal glucose complicating pregnancy: Secondary | ICD-10-CM

## 2012-09-05 DIAGNOSIS — O0993 Supervision of high risk pregnancy, unspecified, third trimester: Secondary | ICD-10-CM

## 2012-09-05 LAB — POCT URINALYSIS DIP (DEVICE)
Bilirubin Urine: NEGATIVE
Glucose, UA: NEGATIVE mg/dL
Ketones, ur: NEGATIVE mg/dL
Leukocytes, UA: NEGATIVE

## 2012-09-05 NOTE — Patient Instructions (Signed)
Vaginal Birth After Cesarean Delivery  Vaginal birth after Cesarean delivery (VBAC) is giving birth vaginally after previously delivering a baby by a cesarean. In the past, if a woman had a Cesarean delivery, all births afterwards would be done by Cesarean delivery. This is no longer true. It can be safe for the mother to try a vaginal delivery after having a Cesarean. The final decision to have a VBAC or repeat Cesarean delivery should be between the patient and her caregiver. The risks and benefits can be discussed relative to the reason for, and the type of the previous Cesarean delivery.  WOMEN WHO PLAN TO HAVE A VBAC SHOULD CHECK WITH THEIR DOCTOR TO BE SURE THAT:  · The previous Cesarean was done with a low transverse uterine incision (not a vertical classical incision).  · The birth canal is big enough for the baby.  · There were no other operations on the uterus.  · They will have an electronic fetal monitor (EFM) on at all times during labor.  · An operating room would be available and ready in case an emergency Cesarean is needed.  · A doctor and surgical nursing staff would be available at all times during labor to be ready to do an emergency Cesarean if necessary.  · An anesthesiologist would be present in case an emergency Cesarean is needed.  · The nursery is prepared and has adequate personnel and necessary equipment available to care for the baby in case of an emergency Cesarean.  BENEFITS OF VBAC:  · Shorter stay in the hospital.  · Lower delivery, nursery and hospital costs.  · Less blood loss and need for blood transfusions.  · Less fever and discomfort from major surgery.  · Lower risk of blood clots.  · Lower risk of infection.  · Shorter recovery after going home.  · Lower risk of other surgical complications, such as opening of the incision or hernia in the incision.  · Decreased risk of injury to other organs.  · Decreased risk for having to remove the uterus (hysterectomy).  · Decreased risk  for the placenta to completely or partially cover the opening of the uterus (placenta previa) with a future pregnancy.  · Ability to have a larger family if desired.  RISKS OF A VBAC:  · Rupture of the uterus.  · Having to remove the uterus (hysterectomy) if it ruptures.  · All the complications of major surgery and/or injury to other organs.  · Excessive bleeding, blood clots and infection.  · Lower Apgar scores (method to evaluate the newborn based on appearance, pulse, grimace, activity, and respiration) and more risks to the baby.  · There is a higher risk of uterine rupture if you induce or augment labor.  · There is a higher risk of uterine rupture if you use medications to ripen the cervix.  VBAC SHOULD NOT BE DONE IF:  · The previous Cesarean was done with a vertical (classical) or T-shaped incision, or you do not know what kind of an incision was made.  · You had a ruptured uterus.  · You had surgery on your uterus.  · You have medical or obstetrical problems.  · There are problems with the baby.  · There were two previous Cesarean deliveries and no vaginal deliveries.  OTHER FACTS TO KNOW ABOUT VBAC:  · It is safe to have an epidural anesthetic with VBAC.  · It is safe to turn the baby from a breech position (attempt an external   cephalic version).  · It is safe to try a VBAC with twins.  · Pregnancies later than 40 weeks have not been successful with VBAC.  · There is an increased failure rate of a VBAC in obese pregnant women.  · There is an increased failure rate with VABC if the baby weighs 8.8 pounds (4000 grams) or more.  · There is an increased failure rate if the time between the Cesarean and VBAC is less than 19 months.  · There is an increased failure rate if pre-eclampsia is present (high blood pressure, protein in the urine and swelling of face and extremities).  · VBAC is very successful if there was a previous vaginal birth.  · VBAC is very successful when the labor starts spontaneously before  the due date.  · Delivery of VBAC is similar to having a normal spontaneous vaginal delivery.  It is important to discuss VBAC with your caregiver early in the pregnancy so you can understand the risks, benefits and options. It will give you time to decide what is best in your particular case relevant to the reason for your previous Cesarean delivery. It should be understood that medical changes in the mother or pregnancy may occur during the pregnancy, which make it necessary to change you or your caregiver's initial decision. The counseling, concerns and decisions should be documented in the medical record and signed by all parties.  Document Released: 10/11/2006 Document Revised: 07/13/2011 Document Reviewed: 06/01/2008  ExitCare® Patient Information ©2013 [employer].

## 2012-09-05 NOTE — Progress Notes (Signed)
Good diet control of BG. Considering VBAC  Growth [place] 38 week

## 2012-09-06 LAB — GC/CHLAMYDIA PROBE AMP
CT Probe RNA: NEGATIVE
GC Probe RNA: NEGATIVE

## 2012-09-08 ENCOUNTER — Encounter (HOSPITAL_COMMUNITY): Payer: Self-pay | Admitting: Pharmacy Technician

## 2012-09-09 ENCOUNTER — Telehealth: Payer: Self-pay | Admitting: *Deleted

## 2012-09-09 NOTE — Telephone Encounter (Signed)
Renee Williams called and left a message stating she got a message about scheduling a ultrasound and she wants to see if we can schedule it either before or after her appointment Monday 09/12/12 with Dr. Debroah Loop because she lives out of town. And states may leave message on voicemail. Called and scheduled [place] appointment . Called Troi and informed her of times for both OB fu and [place] appointments.

## 2012-09-12 ENCOUNTER — Ambulatory Visit (INDEPENDENT_AMBULATORY_CARE_PROVIDER_SITE_OTHER): Payer: BC Managed Care – PPO | Admitting: Obstetrics & Gynecology

## 2012-09-12 VITALS — BP 122/84 | Temp 97.7°F | Wt 242.3 lb

## 2012-09-12 DIAGNOSIS — O09529 Supervision of elderly multigravida, unspecified trimester: Secondary | ICD-10-CM

## 2012-09-12 LAB — POCT URINALYSIS DIP (DEVICE)
Glucose, UA: NEGATIVE mg/dL
Hgb urine dipstick: NEGATIVE
Nitrite: NEGATIVE
Urobilinogen, UA: 0.2 mg/dL (ref 0.0–1.0)

## 2012-09-12 NOTE — Patient Instructions (Addendum)

## 2012-09-12 NOTE — Progress Notes (Signed)
States FBS 70-80, still good control, plans to [facility]. Counseled re risks, signed consent   [place] scheduled

## 2012-09-12 NOTE — Progress Notes (Signed)
Pulse- 86  Edema-feet/ankles  Pain/pressure-back

## 2012-09-13 ENCOUNTER — Ambulatory Visit (HOSPITAL_COMMUNITY)
Admission: RE | Admit: 2012-09-13 | Discharge: 2012-09-13 | Disposition: A | Discharge status: Home / Self Care | Payer: BC Managed Care – PPO | Source: Ambulatory Visit | Attending: Obstetrics & Gynecology | Admitting: Obstetrics & Gynecology

## 2012-09-13 DIAGNOSIS — O0993 Supervision of high risk pregnancy, unspecified, third trimester: Secondary | ICD-10-CM

## 2012-09-13 DIAGNOSIS — O99810 Abnormal glucose complicating pregnancy: Secondary | ICD-10-CM | POA: Insufficient documentation

## 2012-09-13 DIAGNOSIS — O09299 Supervision of pregnancy with other poor reproductive or obstetric history, unspecified trimester: Secondary | ICD-10-CM | POA: Insufficient documentation

## 2012-09-19 ENCOUNTER — Telehealth (HOSPITAL_COMMUNITY): Payer: Self-pay | Admitting: *Deleted

## 2012-09-19 ENCOUNTER — Inpatient Hospital Stay (HOSPITAL_COMMUNITY): Admission: RE | Admit: 2012-09-19 | Payer: BC Managed Care – PPO | Source: Ambulatory Visit

## 2012-09-19 ENCOUNTER — Encounter: Payer: BC Managed Care – PPO | Admitting: Obstetrics & Gynecology

## 2012-09-19 ENCOUNTER — Ambulatory Visit (INDEPENDENT_AMBULATORY_CARE_PROVIDER_SITE_OTHER): Payer: BC Managed Care – PPO | Admitting: Obstetrics & Gynecology

## 2012-09-19 VITALS — BP 128/85 | Temp 97.3°F | Wt 252.4 lb

## 2012-09-19 DIAGNOSIS — O0990 Supervision of high risk pregnancy, unspecified, unspecified trimester: Secondary | ICD-10-CM

## 2012-09-19 DIAGNOSIS — O34219 Maternal care for unspecified type scar from previous cesarean delivery: Secondary | ICD-10-CM

## 2012-09-19 LAB — POCT URINALYSIS DIP (DEVICE)
Bilirubin Urine: NEGATIVE
Leukocytes, UA: NEGATIVE
Nitrite: NEGATIVE
pH: 6 (ref 5.0–8.0)

## 2012-09-19 NOTE — Patient Instructions (Signed)
Return to clinic for any obstetric concerns or go to [facility] for evaluation  

## 2012-09-19 NOTE — Progress Notes (Signed)
Pulse- 79 Patient reports large amount of swelling/fluid in the past week, has gained 10lb.

## 2012-09-19 NOTE — Telephone Encounter (Signed)
Preadmission screen  

## 2012-09-19 NOTE — Progress Notes (Signed)
IOL scheduled for 09/21/12 at 0730, patient is very scared about being preeclamptic given her 10 lb weight gain over one week and 1+ proteinuria.  Was not allowed to get an epidural last delivery and really desires a 39 week induction.  Has A1GDM, AMA, previous cesarean section. CBGs are within range.  No other complaints or concerns.  Fetal movement and labor precautions reviewed.

## 2012-09-21 ENCOUNTER — Inpatient Hospital Stay (HOSPITAL_COMMUNITY): Payer: BC Managed Care – PPO | Admitting: Certified Registered"

## 2012-09-21 ENCOUNTER — Inpatient Hospital Stay (HOSPITAL_COMMUNITY)
Admission: RE | Admit: 2012-09-21 | Discharge: 2012-09-23 | DRG: 651 | Disposition: A | Discharge status: Home / Self Care | Payer: BC Managed Care – PPO | Source: Ambulatory Visit | Attending: Obstetrics & Gynecology | Admitting: Obstetrics & Gynecology

## 2012-09-21 ENCOUNTER — Encounter (HOSPITAL_COMMUNITY)
Admission: RE | Disposition: A | Discharge status: Home / Self Care | Payer: Self-pay | Source: Ambulatory Visit | Attending: Obstetrics & Gynecology

## 2012-09-21 ENCOUNTER — Inpatient Hospital Stay (HOSPITAL_COMMUNITY)
Admission: AD | Admit: 2012-09-21 | Payer: BC Managed Care – PPO | Source: Ambulatory Visit | Admitting: Obstetrics & Gynecology

## 2012-09-21 ENCOUNTER — Encounter (HOSPITAL_COMMUNITY): Payer: Self-pay

## 2012-09-21 ENCOUNTER — Encounter (HOSPITAL_COMMUNITY): Admission: AD | Payer: Self-pay | Source: Ambulatory Visit

## 2012-09-21 ENCOUNTER — Encounter (HOSPITAL_COMMUNITY): Payer: Self-pay | Admitting: Certified Registered"

## 2012-09-21 VITALS — BP 118/73 | HR 97 | Temp 97.4°F | Resp 18 | Ht 67.0 in | Wt 252.0 lb

## 2012-09-21 DIAGNOSIS — O34219 Maternal care for unspecified type scar from previous cesarean delivery: Secondary | ICD-10-CM

## 2012-09-21 DIAGNOSIS — Z9851 Tubal ligation status: Secondary | ICD-10-CM

## 2012-09-21 DIAGNOSIS — O09523 Supervision of elderly multigravida, third trimester: Secondary | ICD-10-CM

## 2012-09-21 DIAGNOSIS — O99810 Abnormal glucose complicating pregnancy: Secondary | ICD-10-CM

## 2012-09-21 DIAGNOSIS — O99814 Abnormal glucose complicating childbirth: Secondary | ICD-10-CM | POA: Diagnosis present

## 2012-09-21 DIAGNOSIS — O139 Gestational [pregnancy-induced] hypertension without significant proteinuria, unspecified trimester: Secondary | ICD-10-CM

## 2012-09-21 DIAGNOSIS — Z302 Encounter for sterilization: Secondary | ICD-10-CM

## 2012-09-21 DIAGNOSIS — O0990 Supervision of high risk pregnancy, unspecified, unspecified trimester: Secondary | ICD-10-CM

## 2012-09-21 DIAGNOSIS — O09529 Supervision of elderly multigravida, unspecified trimester: Secondary | ICD-10-CM | POA: Diagnosis present

## 2012-09-21 DIAGNOSIS — Z98891 History of uterine scar from previous surgery: Secondary | ICD-10-CM

## 2012-09-21 DIAGNOSIS — O09293 Supervision of pregnancy with other poor reproductive or obstetric history, third trimester: Secondary | ICD-10-CM

## 2012-09-21 HISTORY — DX: Gestational (pregnancy-induced) hypertension without significant proteinuria, unspecified trimester: O13.9

## 2012-09-21 HISTORY — DX: Calculus of gallbladder without cholecystitis without obstruction: K80.20

## 2012-09-21 LAB — RPR: RPR Ser Ql: NONREACTIVE

## 2012-09-21 LAB — CBC
HCT: 33.1 % — ABNORMAL LOW (ref 36.0–46.0)
Hemoglobin: 11.3 g/dL — ABNORMAL LOW (ref 12.0–15.0)
MCHC: 34.1 g/dL (ref 30.0–36.0)
WBC: 8.2 10*3/uL (ref 4.0–10.5)

## 2012-09-21 LAB — COMPREHENSIVE METABOLIC PANEL
ALT: 8 U/L (ref 0–35)
AST: 14 U/L (ref 0–37)
Alkaline Phosphatase: 101 U/L (ref 39–117)
CO2: 23 mEq/L (ref 19–32)
Calcium: 8.8 mg/dL (ref 8.4–10.5)
GFR calc Af Amer: >90 mL/min (ref >90)
GFR calc non Af Amer: >90 mL/min (ref >90)
Glucose, Bld: 79 mg/dL (ref 70–99)
Potassium: 3.8 mEq/L (ref 3.5–5.1)
Sodium: 138 mEq/L (ref 135–145)
Total Protein: 5.3 g/dL — ABNORMAL LOW (ref 6.0–8.3)

## 2012-09-21 SURGERY — Surgical Case
Anesthesia: Regional | Site: Abdomen | Laterality: Bilateral

## 2012-09-21 SURGERY — Surgical Case
Anesthesia: Spinal | Site: Abdomen | Wound class: Clean Contaminated

## 2012-09-21 MED ORDER — ZOLPIDEM TARTRATE 5 MG PO TABS: 5.0000 mg | ORAL_TABLET | Freq: Every evening | ORAL | Status: DC | PRN

## 2012-09-21 MED ORDER — CEFAZOLIN SODIUM-DEXTROSE 2-3 GM-% IV SOLR
2.0000 g | Freq: Once | INTRAVENOUS | Status: AC
  Administered 2012-09-21: 2 g via INTRAVENOUS

## 2012-09-21 MED ORDER — OXYTOCIN 40 UNITS IN LACTATED RINGERS INFUSION - SIMPLE MED: 62.5000 mL/h | INTRAVENOUS | Status: AC

## 2012-09-21 MED ORDER — DIPHENHYDRAMINE HCL 50 MG/ML IJ SOLN: 25.0000 mg | INTRAMUSCULAR | Status: DC | PRN

## 2012-09-21 MED ORDER — CITRIC ACID-SODIUM CITRATE 334-500 MG/5ML PO SOLN
30.0000 mL | ORAL | Status: DC | PRN
  Administered 2012-09-21: 30 mL via ORAL
  Filled 2012-09-21 (×2): qty 15

## 2012-09-21 MED ORDER — DIPHENHYDRAMINE HCL 25 MG PO CAPS
25.0000 mg | ORAL_CAPSULE | Freq: Four times a day (QID) | ORAL | Status: DC | PRN
  Filled 2012-09-21: qty 1

## 2012-09-21 MED ORDER — SODIUM CHLORIDE 0.9 % IJ SOLN: 3.0000 mL | INTRAMUSCULAR | Status: DC | PRN

## 2012-09-21 MED ORDER — CEFAZOLIN SODIUM-DEXTROSE 2-3 GM-% IV SOLR: 2.0000 g | INTRAVENOUS | Status: DC

## 2012-09-21 MED ORDER — SCOPOLAMINE 1 MG/3DAYS TD PT72
1.0000 | MEDICATED_PATCH | Freq: Once | TRANSDERMAL | Status: DC
  Administered 2012-09-21: 1.5 mg via TRANSDERMAL

## 2012-09-21 MED ORDER — PHENYLEPHRINE HCL 10 MG/ML IJ SOLN
INTRAMUSCULAR | Status: DC | PRN
  Administered 2012-09-21: 80 ug via INTRAVENOUS
  Administered 2012-09-21: 40 ug via INTRAVENOUS
  Administered 2012-09-21: 80 ug via INTRAVENOUS

## 2012-09-21 MED ORDER — DIPHENHYDRAMINE HCL 50 MG/ML IJ SOLN: 12.5000 mg | INTRAMUSCULAR | Status: DC | PRN

## 2012-09-21 MED ORDER — CEFAZOLIN SODIUM-DEXTROSE 2-3 GM-% IV SOLR
INTRAVENOUS | Status: AC
  Filled 2012-09-21: qty 50

## 2012-09-21 MED ORDER — BUPIVACAINE IN DEXTROSE 0.75-8.25 % IT SOLN
INTRATHECAL | Status: DC | PRN
  Administered 2012-09-21: 1.7 mL via INTRATHECAL

## 2012-09-21 MED ORDER — OXYCODONE-ACETAMINOPHEN 5-325 MG PO TABS: 1.0000 | ORAL_TABLET | ORAL | Status: DC | PRN

## 2012-09-21 MED ORDER — OXYTOCIN 40 UNITS IN LACTATED RINGERS INFUSION - SIMPLE MED: 62.5000 mL/h | INTRAVENOUS | Status: DC

## 2012-09-21 MED ORDER — OXYTOCIN BOLUS FROM INFUSION: 500.0000 mL | INTRAVENOUS | Status: DC

## 2012-09-21 MED ORDER — METHYLERGONOVINE MALEATE 0.2 MG/ML IJ SOLN
INTRAMUSCULAR | Status: AC
  Administered 2012-09-21: 0.2 mg via INTRAMUSCULAR
  Filled 2012-09-21: qty 1

## 2012-09-21 MED ORDER — MORPHINE SULFATE (PF) 0.5 MG/ML IJ SOLN
INTRAMUSCULAR | Status: DC | PRN
  Administered 2012-09-21: .15 mg via EPIDURAL

## 2012-09-21 MED ORDER — NALOXONE HCL 0.4 MG/ML IJ SOLN: 0.4000 mg | INTRAMUSCULAR | Status: DC | PRN

## 2012-09-21 MED ORDER — LACTATED RINGERS IV SOLN
INTRAVENOUS | Status: DC
  Administered 2012-09-21 (×2): via INTRAVENOUS

## 2012-09-21 MED ORDER — LACTATED RINGERS IV SOLN: INTRAVENOUS | Status: DC

## 2012-09-21 MED ORDER — DIPHENHYDRAMINE HCL 25 MG PO CAPS
25.0000 mg | ORAL_CAPSULE | ORAL | Status: DC | PRN
  Filled 2012-09-21: qty 1

## 2012-09-21 MED ORDER — LACTATED RINGERS IV SOLN: 500.0000 mL | INTRAVENOUS | Status: DC | PRN

## 2012-09-21 MED ORDER — SIMETHICONE 80 MG PO CHEW
80.0000 mg | CHEWABLE_TABLET | Freq: Three times a day (TID) | ORAL | Status: DC
  Administered 2012-09-22: 80 mg via ORAL

## 2012-09-21 MED ORDER — OXYTOCIN 10 UNIT/ML IJ SOLN
INTRAMUSCULAR | Status: AC
  Filled 2012-09-21: qty 4

## 2012-09-21 MED ORDER — MORPHINE SULFATE 0.5 MG/ML IJ SOLN
INTRAMUSCULAR | Status: AC
  Filled 2012-09-21: qty 10

## 2012-09-21 MED ORDER — IBUPROFEN 600 MG PO TABS: 600.0000 mg | ORAL_TABLET | Freq: Four times a day (QID) | ORAL | Status: DC | PRN

## 2012-09-21 MED ORDER — LIDOCAINE HCL (PF) 1 % IJ SOLN: 30.0000 mL | INTRAMUSCULAR | Status: DC | PRN

## 2012-09-21 MED ORDER — ACETAMINOPHEN 325 MG PO TABS: 650.0000 mg | ORAL_TABLET | ORAL | Status: DC | PRN

## 2012-09-21 MED ORDER — TETANUS-DIPHTH-ACELL PERTUSSIS 5-2.5-18.5 LF-MCG/0.5 IM SUSP: 0.5000 mL | Freq: Once | INTRAMUSCULAR | Status: DC

## 2012-09-21 MED ORDER — ONDANSETRON HCL 4 MG PO TABS: 4.0000 mg | ORAL_TABLET | ORAL | Status: DC | PRN

## 2012-09-21 MED ORDER — METOCLOPRAMIDE HCL 5 MG/ML IJ SOLN: 10.0000 mg | Freq: Three times a day (TID) | INTRAMUSCULAR | Status: DC | PRN

## 2012-09-21 MED ORDER — MENTHOL 3 MG MT LOZG: 1.0000 | LOZENGE | OROMUCOSAL | Status: DC | PRN

## 2012-09-21 MED ORDER — SENNOSIDES-DOCUSATE SODIUM 8.6-50 MG PO TABS
2.0000 | ORAL_TABLET | Freq: Every day | ORAL | Status: DC
  Administered 2012-09-22: 2 via ORAL

## 2012-09-21 MED ORDER — NALBUPHINE HCL 10 MG/ML IJ SOLN: 5.0000 mg | INTRAMUSCULAR | Status: DC | PRN

## 2012-09-21 MED ORDER — SCOPOLAMINE 1 MG/3DAYS TD PT72
MEDICATED_PATCH | TRANSDERMAL | Status: AC
  Filled 2012-09-21: qty 1

## 2012-09-21 MED ORDER — DIBUCAINE 1 % RE OINT: 1.0000 "application " | TOPICAL_OINTMENT | RECTAL | Status: DC | PRN

## 2012-09-21 MED ORDER — METHYLERGONOVINE MALEATE 0.2 MG/ML IJ SOLN: 0.2000 mg | Freq: Once | INTRAMUSCULAR | Status: AC

## 2012-09-21 MED ORDER — LANOLIN HYDROUS EX OINT: 1.0000 "application " | TOPICAL_OINTMENT | CUTANEOUS | Status: DC | PRN

## 2012-09-21 MED ORDER — ONDANSETRON HCL 4 MG/2ML IJ SOLN
4.0000 mg | Freq: Three times a day (TID) | INTRAMUSCULAR | Status: DC | PRN
  Filled 2012-09-21: qty 2

## 2012-09-21 MED ORDER — FENTANYL CITRATE 0.05 MG/ML IJ SOLN
INTRAMUSCULAR | Status: AC
  Filled 2012-09-21: qty 2

## 2012-09-21 MED ORDER — WITCH HAZEL-GLYCERIN EX PADS: 1.0000 "application " | MEDICATED_PAD | CUTANEOUS | Status: DC | PRN

## 2012-09-21 MED ORDER — SIMETHICONE 80 MG PO CHEW: 80.0000 mg | CHEWABLE_TABLET | ORAL | Status: DC | PRN

## 2012-09-21 MED ORDER — DEXTROSE 5 % IV SOLN: 1.0000 ug/kg/h | INTRAVENOUS | Status: DC | PRN

## 2012-09-21 MED ORDER — ONDANSETRON HCL 4 MG/2ML IJ SOLN
INTRAMUSCULAR | Status: DC | PRN
  Administered 2012-09-21: 4 mg via INTRAVENOUS

## 2012-09-21 MED ORDER — MEPERIDINE HCL 25 MG/ML IJ SOLN: 6.2500 mg | INTRAMUSCULAR | Status: DC | PRN

## 2012-09-21 MED ORDER — ONDANSETRON HCL 4 MG/2ML IJ SOLN: 4.0000 mg | Freq: Four times a day (QID) | INTRAMUSCULAR | Status: DC | PRN

## 2012-09-21 MED ORDER — PRENATAL MULTIVITAMIN CH
1.0000 | ORAL_TABLET | Freq: Every day | ORAL | Status: DC
  Administered 2012-09-23: 1 via ORAL
  Filled 2012-09-21 (×2): qty 1

## 2012-09-21 MED ORDER — LACTATED RINGERS IV SOLN
INTRAVENOUS | Status: DC | PRN
  Administered 2012-09-21 (×2): via INTRAVENOUS

## 2012-09-21 MED ORDER — OXYTOCIN 10 UNIT/ML IJ SOLN
40.0000 [IU] | INTRAVENOUS | Status: DC | PRN
  Administered 2012-09-21: 40 [IU] via INTRAVENOUS

## 2012-09-21 MED ORDER — LACTATED RINGERS IV SOLN
INTRAVENOUS | Status: DC
  Administered 2012-09-21 – 2012-09-22 (×2): via INTRAVENOUS

## 2012-09-21 MED ORDER — OXYTOCIN 40 UNITS IN LACTATED RINGERS INFUSION - SIMPLE MED
1.0000 m[IU]/min | INTRAVENOUS | Status: DC
  Administered 2012-09-21: 1 m[IU]/min via INTRAVENOUS
  Filled 2012-09-21: qty 1000

## 2012-09-21 MED ORDER — IBUPROFEN 600 MG PO TABS
600.0000 mg | ORAL_TABLET | Freq: Four times a day (QID) | ORAL | Status: DC
  Administered 2012-09-21 – 2012-09-23 (×8): 600 mg via ORAL
  Filled 2012-09-21 (×5): qty 1
  Filled 2012-09-21: qty 2
  Filled 2012-09-21 (×2): qty 1

## 2012-09-21 MED ORDER — ACETAMINOPHEN 10 MG/ML IV SOLN
1000.0000 mg | Freq: Four times a day (QID) | INTRAVENOUS | Status: AC | PRN
  Filled 2012-09-21: qty 100

## 2012-09-21 MED ORDER — FENTANYL CITRATE 0.05 MG/ML IJ SOLN
INTRAMUSCULAR | Status: DC | PRN
  Administered 2012-09-21: 25 ug via INTRATHECAL

## 2012-09-21 MED ORDER — FENTANYL CITRATE 0.05 MG/ML IJ SOLN: 25.0000 ug | INTRAMUSCULAR | Status: DC | PRN

## 2012-09-21 MED ORDER — LACTATED RINGERS IV SOLN
INTRAVENOUS | Status: DC | PRN
  Administered 2012-09-21: 16:00:00 via INTRAVENOUS

## 2012-09-21 MED ORDER — ONDANSETRON HCL 4 MG/2ML IJ SOLN
INTRAMUSCULAR | Status: AC
  Filled 2012-09-21: qty 2

## 2012-09-21 MED ORDER — ONDANSETRON HCL 4 MG/2ML IJ SOLN
4.0000 mg | INTRAMUSCULAR | Status: DC | PRN
  Administered 2012-09-21: 4 mg via INTRAVENOUS

## 2012-09-21 MED ORDER — OXYCODONE-ACETAMINOPHEN 5-325 MG PO TABS
1.0000 | ORAL_TABLET | ORAL | Status: DC | PRN
  Administered 2012-09-22: 1 via ORAL
  Administered 2012-09-22: 2 via ORAL
  Administered 2012-09-22 – 2012-09-23 (×3): 1 via ORAL
  Administered 2012-09-23: 2 via ORAL
  Filled 2012-09-21: qty 1
  Filled 2012-09-21: qty 2
  Filled 2012-09-21 (×3): qty 1
  Filled 2012-09-21: qty 2

## 2012-09-21 SURGICAL SUPPLY — 33 items
BARRIER ADHS 3X4 INTERCEED (GAUZE/BANDAGES/DRESSINGS) IMPLANT
CLIP FILSHIE TUBAL LIGA STRL (Clip) ×3 IMPLANT
CLOTH BEACON ORANGE TIMEOUT ST (SAFETY) ×3 IMPLANT
DRAPE LG THREE QUARTER DISP (DRAPES) ×3 IMPLANT
DRESSING TELFA 8X3 (GAUZE/BANDAGES/DRESSINGS) ×6 IMPLANT
DRSG OPSITE POSTOP 4X10 (GAUZE/BANDAGES/DRESSINGS) ×3 IMPLANT
DURAPREP 26ML APPLICATOR (WOUND CARE) ×3 IMPLANT
ELECT REM PT RETURN 9FT ADLT (ELECTROSURGICAL) ×3
ELECTRODE REM PT RTRN 9FT ADLT (ELECTROSURGICAL) ×2 IMPLANT
EXTRACTOR VACUUM KIWI (MISCELLANEOUS) IMPLANT
GAUZE SPONGE 4X4 12PLY STRL LF (GAUZE/BANDAGES/DRESSINGS) ×3 IMPLANT
GLOVE BIO SURGEON STRL SZ 6.5 (GLOVE) ×3 IMPLANT
GLOVE BIOGEL PI IND STRL 7.0 (GLOVE) ×2 IMPLANT
GLOVE BIOGEL PI INDICATOR 7.0 (GLOVE) ×1
GOWN STRL REIN XL XLG (GOWN DISPOSABLE) ×6 IMPLANT
KIT ABG SYR 3ML LUER SLIP (SYRINGE) IMPLANT
NEEDLE HYPO 25X5/8 SAFETYGLIDE (NEEDLE) IMPLANT
NS IRRIG 1000ML POUR BTL (IV SOLUTION) ×3 IMPLANT
PACK C SECTION WH (CUSTOM PROCEDURE TRAY) ×3 IMPLANT
PAD ABD 7.5X8 STRL (GAUZE/BANDAGES/DRESSINGS) ×6 IMPLANT
PAD OB MATERNITY 4.3X12.25 (PERSONAL CARE ITEMS) ×3 IMPLANT
RETRACTOR WND ALEXIS 25 LRG (MISCELLANEOUS) ×2 IMPLANT
RTRCTR WOUND ALEXIS 25CM LRG (MISCELLANEOUS) ×3
SPONGE GAUZE 4X4 12PLY (GAUZE/BANDAGES/DRESSINGS) ×6 IMPLANT
SUT PLAIN 2 0 XLH (SUTURE) ×3 IMPLANT
SUT VIC AB 0 CT1 36 (SUTURE) ×18 IMPLANT
SUT VIC AB 2-0 CT1 27 (SUTURE) ×1
SUT VIC AB 2-0 CT1 TAPERPNT 27 (SUTURE) ×2 IMPLANT
SUT VIC AB 4-0 PS2 27 (SUTURE) ×3 IMPLANT
TAPE CLOTH SURG 4X10 WHT LF (GAUZE/BANDAGES/DRESSINGS) ×6 IMPLANT
TOWEL OR 17X24 6PK STRL BLUE (TOWEL DISPOSABLE) ×9 IMPLANT
TRAY FOLEY CATH 14FR (SET/KITS/TRAYS/PACK) ×3 IMPLANT
WATER STERILE IRR 1000ML POUR (IV SOLUTION) ×3 IMPLANT

## 2012-09-21 NOTE — Progress Notes (Addendum)
Patient ID: Renee Williams, female   DOB: [birth year], 42 y.o.   MRN: [id number] Patient does not wish to proceed with cervical ripening and IOL and requests repeat cesarean section. The risks benefits of TOLAC vs CS and anesthesia, bleeding, infection, visceral organ damage were discussed and questions were answered. She had clear liquid at 1330.  Adam Phenix, MD 09/21/2012 2:51 PM She requests BTL. The procedure and the risk of anesthesia, bleeding, infection, bowel and bladder injury, failure (1/200) and ectopic pregnancy were discussed and her questions were answered. Adam Phenix, MD

## 2012-09-21 NOTE — Consult Note (Signed)
Neonatology Note:  Attendance at C-section:  I was asked by Dr. Arnold to attend this repeat C/S at term. The mother is a G3P1A1 O pos, GBS neg with diet-controlled GDM and PIH. ROM at delivery, fluid clear. Infant vigorous with good spontaneous cry and tone. Needed only minimal bulb suctioning. Ap 9/9. Lungs clear to ausc in DR. To CN to care of Pediatrician.  Bekka Qian C. Vernona Peake, MD  

## 2012-09-21 NOTE — Anesthesia Procedure Notes (Addendum)
Epidural   Spinal  Patient location during procedure: OR Start time: 09/21/2012 3:33 PM Staffing Anesthesiologist: Maddie Brazier A. Performed by: anesthesiologist  Preanesthetic Checklist Completed: patient identified, site marked, surgical consent, pre-op evaluation, timeout performed, IV checked, risks and benefits discussed and monitors and equipment checked Spinal Block Patient position: sitting Prep: site prepped and draped and DuraPrep Patient monitoring: heart rate, continuous pulse ox and blood pressure Approach: midline Location: L3-4 Injection technique: single-shot Needle Needle type: Sprotte  Needle gauge: 24 G Needle length: 9 cm Needle insertion depth: 7 cm Assessment Sensory level: T4 Events: paresthesia Additional Notes Patient tolerated procedure well. Adequate sensory level. Transient paresthesia right leg.

## 2012-09-21 NOTE — Preoperative (Signed)
Beta Blockers   Reason not to administer Beta Blockers:Not Applicable 

## 2012-09-21 NOTE — Transfer of Care (Signed)
Immediate Anesthesia Transfer of Care Note  Patient: Renee Williams  Procedure(s) Performed: Procedure(s): CESAREAN SECTION WITH BILATERAL TUBAL LIGATION (N/A)  Patient Location: PACU  Anesthesia Type:Spinal  Level of Consciousness: awake, alert  and oriented  Airway & Oxygen Therapy: Patient Spontanous Breathing  Post-op Assessment: Report given to PACU RN and Post -op Vital signs reviewed and stable  Post vital signs: Reviewed and stable  Complications: No apparent anesthesia complications

## 2012-09-21 NOTE — Progress Notes (Signed)
Patient with large emesis. Reinforced po sips and chips slowly. Pt verbalized understanding.Zofran given

## 2012-09-21 NOTE — Anesthesia Postprocedure Evaluation (Signed)
  Anesthesia Post-op Note  Patient: Renee Williams  Procedure(s) Performed: Procedure(s): CESAREAN SECTION WITH BILATERAL TUBAL LIGATION (N/A)  Patient Location: PACU  Anesthesia Type:Spinal  Level of Consciousness: awake, alert  and oriented  Airway and Oxygen Therapy: Patient Spontanous Breathing  Post-op Pain: none  Post-op Assessment: Post-op Vital signs reviewed, Patient's Cardiovascular Status Stable, Respiratory Function Stable, Patent Airway, No signs of Nausea or vomiting, Pain level controlled, No headache and No backache  Post-op Vital Signs: Reviewed and stable  Complications: No apparent anesthesia complications

## 2012-09-21 NOTE — H&P (Signed)
Renee Williams is a 42 y.o. female presenting for induction of labor for gestational diabetes and pregnancy induced hypertension. Maternal Medical History:  Fetal activity: Perceived fetal activity is normal.   Last perceived fetal movement was within the past hour.    Prenatal complications: PIH.     OB History   Grav Para Term Preterm Abortions TAB SAB Ect Mult Living   3 1 1  1  1   1      Past Medical History  Diagnosis Date  . HELLP syndrome 2009  . Pregnancy induced hypertension   . Gallstones   . Gestational diabetes     Diet controlled   Past Surgical History  Procedure Laterality Date  . Cesarean section    . Wisdom tooth extraction     Family History: family history includes Cancer in her maternal grandfather; Diabetes in her maternal aunt, maternal grandmother, maternal uncle, and paternal grandmother; Hypertension in her maternal grandmother; and Stroke in her maternal aunt. Social History:  reports that she has never smoked. She has never used smokeless tobacco. She reports that she does not drink alcohol or use illicit drugs.   Prenatal Transfer Tool  Maternal Diabetes: Yes:  Diabetes Type:  Diet controlled Genetic Screening: Declined Maternal Ultrasounds/Referrals: Normal Fetal Ultrasounds or other Referrals:  None Maternal Substance Abuse:  No Significant Maternal Medications:  None Significant Maternal Lab Results:  Lab values include: Group B Strep negative Other Comments:  Gestational Hypertension - no meds  Review of Systems  Eyes: Negative.   Gastrointestinal: Negative for abdominal pain.  Neurological: Negative for headaches.  All other systems reviewed and are negative.      Blood pressure 129/75, pulse 73, temperature 98 F (36.7 C), temperature source Oral, resp. rate 18, height 5\' 7"  (1.702 m), weight 114.306 kg (252 lb), last menstrual period 12/23/2011. Maternal Exam:  Abdomen: Surgical scars: low transverse.   Estimated fetal weight is  7-7.5 lbs.   Fetal presentation: vertex  Introitus: Vagina is positive for vaginal discharge (mucusy).    Physical Exam  Constitutional: She is oriented to person, place, and time. She appears well-developed and well-nourished. No distress.  HENT:  Head: Normocephalic.  Neck: Normal range of motion. Neck supple.  Cardiovascular: Normal rate, regular rhythm and normal heart sounds.   Respiratory: Effort normal and breath sounds normal.  GI: Soft. There is no tenderness.  Genitourinary: No bleeding around the vagina. Vaginal discharge (mucusy) found.  Neurological: She is alert and oriented to person, place, and time.  Skin: Skin is warm and dry.   Dilation: Closed Effacement (%): Thick Cervical Position: Posterior Station: -3 Presentation: Vertex Exam by:: Roney Marion, CNM  Prenatal labs: ABO, Rh: O/Positive/-- (09/30 0000) Antibody: Negative (09/30 0000) Rubella: Immune (09/30 0000) RPR: Nonreactive (09/30 0000)  HBsAg: Negative (09/30 0000)  HIV: Non-reactive (09/30 0000)  GBS: Negative (05/05 0000)   Assessment/Plan: Gestational Diabetes - Diet Controlled Gestational Hypertension   Trial of Labor after C-Section GBS negative  Plan: Admit to [employer] low dose Pitocin Induction Reassess at 1400 for possible placement of foley bulb  [facility] 09/21/2012, 9:59 AM

## 2012-09-21 NOTE — Anesthesia Preprocedure Evaluation (Signed)
Anesthesia Evaluation  Patient identified by MRN, date of birth, ID band Patient awake    Reviewed: Allergy & Precautions, H&P , NPO status , Patient's Chart, lab work & pertinent test results  Airway Mallampati: III TM Distance: >3 FB Neck ROM: Full    Dental no notable dental hx. (+) Teeth Intact   Pulmonary neg pulmonary ROS,  breath sounds clear to auscultation  Pulmonary exam normal       Cardiovascular hypertension, Pt. on medications and Pt. on home beta blockers Rhythm:Regular Rate:Normal     Neuro/Psych negative psych ROS   GI/Hepatic GERD-  ,  Endo/Other  diabetes, Well Controlled, GestationalMorbid obesity  Renal/GU      Musculoskeletal negative musculoskeletal ROS (+)   Abdominal (+) + obese,   Peds  Hematology   Anesthesia Other Findings   Reproductive/Obstetrics Preecclampsia with las pregnancy with HELLP syndrome Previous C/Section- GA Term Pregnancy- 39 weeks Desires Sterilization                           Anesthesia Physical Anesthesia Plan  ASA: III and emergent  Anesthesia Plan: Spinal   Post-op Pain Management:    Induction:   Airway Management Planned: Natural Airway  Additional Equipment:   Intra-op Plan:   Post-operative Plan:   Informed Consent: I have reviewed the patients History and Physical, chart, labs and discussed the procedure including the risks, benefits and alternatives for the proposed anesthesia with the patient or authorized representative who has indicated his/her understanding and acceptance.   Dental advisory given  Plan Discussed with: Anesthesiologist, Surgeon and CRNA  Anesthesia Plan Comments:         Anesthesia Quick Evaluation

## 2012-09-21 NOTE — Op Note (Signed)
Procedure: Repeat low transverse cesarean section and bilateral tubal ligation Preoperative diagnosis: Intrauterine pregnancy [redacted] weeks gestation, diabetes mellitus, previous cesarean section and desires tubal sterilization Postoperative diagnosis: Intrauterine pregnancy delivered and bilateral tubal sterilization Findings: Liveborn female infant Anesthesia: Spinal Estimated blood loss: 600 mL Specimens: None Drains: Foley catheter Counts: Correct Complications: None   Patient gave written consent for elective repeat cesarean section and bilateral tubal ligation. She had reported for induction of labor and trial of labor after cesarean section. She elected to terminate the induction and have a repeat cesarean section and tubal ligation. Patient identification was confirmed and she was brought to the operating room and spinal anesthesia was induced. She was placed in dorsal supine position left lateral tilt. Abdomen and perineum and vagina were sterilely prepped and draped and a Foley catheter was placed. #10 blade was used to make a Pfannenstiel incision. Incision was carried down to the fascia and fascial incision was extended with scissors. Fascia was separated from underlying tissue attachments with blunt and sharp dissection. Hemostasis was obtained with cautery. Bellies of the rectus muscles were separated. Underlying peritoneum was entered and the incision was extended with Metzenbaum scissors. Alexis retractor was placed. Bladder flap was created and lower uterine segment was entered at midline with #10 blade. Incision was extended transversely and the bag of water was ruptured with light meconium staining. Fetal head was elevated and delivered. Mouth and nose were cleared with bulb suction. Double nuchal cord was reduced. Infant was delivered atraumatically and was vigorous at birth. It was a liveborn female. The cord clamped and cut and infant was handed to nursery personnel. The uterus was  explored and the placenta was delivered. Patient received IV Pitocin. The uterine incision was closed with a running locking suture with 0 Vicryl. An imbricating layer followed with 0 Vicryl. Hemostasis was seen. The right adnexa was inspected. Ovary was normal and the tube was mildly edematous and the fimbria were clubbed. A Filshie clip was placed about 4 cm from the cornu. Good placement was seen. Likewise the left tube and ovary were visualized and were normal. A Filshie clip was applied in normal fashion. Peritoneum was closed with running suture with 2-0 Vicryl. Fascia was closed with running suture with 0 Vicryl. The incision was irrigated and hemostasis was assured. 20 plain gut suture were placed in the subcutaneous layer. The skin was closed with a running subcuticular suture with 4-0 Vicryl. Sterile dressing was applied. Patient tolerated procedure well without complications. She is brought in stable condition to PACU.   Dr. Scheryl Darter May 21st 2014 4:53 PM

## 2012-09-22 ENCOUNTER — Encounter (HOSPITAL_COMMUNITY): Payer: Self-pay | Admitting: Obstetrics & Gynecology

## 2012-09-22 LAB — CBC
HCT: 26 % — ABNORMAL LOW (ref 36.0–46.0)
MCV: 97 fL (ref 78.0–100.0)
RBC: 2.68 MIL/uL — ABNORMAL LOW (ref 3.87–5.11)
WBC: 10.8 10*3/uL — ABNORMAL HIGH (ref 4.0–10.5)

## 2012-09-22 LAB — ABO/RH: ABO/RH(D): O POS

## 2012-09-22 MED ORDER — LACTATED RINGERS IV BOLUS (SEPSIS)
500.0000 mL | Freq: Once | INTRAVENOUS | Status: AC
  Administered 2012-09-22: 500 mL via INTRAVENOUS

## 2012-09-22 NOTE — Progress Notes (Signed)
Subjective: Postpartum Day #1: Cesarean Delivery  Renee Williams is a 42 y.o. female [id number] who delivered yesterday at [redacted]w[redacted]d.  She denies dizziness upon standing.  Patient reports tolerating PO.  Her foley catheter remains in place as her urinary output since her surgery has been low and she has yet to have a bowel movement or pass flatus.  She plans to breast feed exclusively.  She had a tubal ligation yesterday for her birth control needs.      Objective: Vital signs in last 24 hours: Temp:  [97.4 F (36.3 C)-98.5 F (36.9 C)] 97.8 F (36.6 C) (05/22 0520) Pulse Rate:  [66-90] 67 (05/22 0520) Resp:  [12-26] 18 (05/22 0520) BP: (111-145)/(56-98) 123/79 mmHg (05/22 0520) SpO2:  [93 %-99 %] 98 % (05/22 0520)  Physical Exam:  General: alert, cooperative, no distress and moderately obese CVS: RRR, good S1 and S2, no RGM Lungs: CTA bilaterally, no wheeze, rales, or rhonchi ABD: Normoactive bowel sounds x4 quadrants, soft, non-tender, non-distended Lochia: appropriate Uterine Fundus: firm Incision: Dressing is clean, dry, and intact  DVT Evaluation: No evidence of DVT seen on physical exam.  Negative Homan's sign.  Calf/Ankle edema is present.   Recent Labs  09/21/12 0835 09/22/12 0635  HGB 11.3* 8.9*  HCT 33.1* 26.0*    Assessment/Plan: Status post Cesarean section. Doing well postoperatively.  Continue current care for now with plan to remove foley catheter and discontinue maintenance fluid once urinary output increases.  Anna Genre 09/22/2012, 9:03 AM  Seen also by me Agree with note Wynelle Bourgeois CNM

## 2012-09-23 MED ORDER — OXYCODONE-ACETAMINOPHEN 5-325 MG PO TABS: 1.0000 | ORAL_TABLET | ORAL | Status: DC | PRN

## 2012-09-23 MED ORDER — IBUPROFEN 600 MG PO TABS: 600.0000 mg | ORAL_TABLET | Freq: Four times a day (QID) | ORAL | Status: DC

## 2012-09-23 NOTE — Plan of Care (Signed)
Problem: Discharge Progression Outcomes Goal: Complications resolved/controlled Outcome: Completed/Met Date Met:  09/23/12 posst partum edema resolving, urine output improved.

## 2012-09-23 NOTE — Discharge Summary (Signed)
Obstetric Discharge Summary Reason for Admission: induction of labor for [facility]; shortly after admission, she changed her mind and requested a RLTCS/BTL Prenatal Procedures: ultrasound; NST Intrapartum Procedures: cesarean: low cervical, transverse Postpartum Procedures: P.P. tubal ligation Complications-Operative and Postpartum: none Hemoglobin  Date Value Range Status  09/22/2012 8.9* 12.0 - 15.0 g/dL Final     REPEATED TO VERIFY     DELTA CHECK NOTED  02/01/2012 11.1   Final     HCT  Date Value Range Status  09/22/2012 26.0* 36.0 - 46.0 % Final  02/01/2012 34   Final    Physical Exam:  General: well appearing in NAD. Lochia: appropriate Uterine Fundus: firm Incision: healing well, no significant drainage DVT Evaluation: No evidence of DVT seen on physical exam. No cords or calf tenderness. Calf/Ankle edema is present.  Discharge Diagnoses: Term Pregnancy-delivered; Gestational diabetes; Gestational HTN  Discharge Information: Date: 09/23/2012 Activity: pelvic rest Diet: routine Medications: PNV, Ibuprofen and Percocet Condition: stable Instructions: refer to practice specific booklet Discharge to: home Follow-up Information   Call [facility]. (For a post-partum appointment)    Contact information:   [address] [place] [address]       Newborn Data: Live born female  Birth Weight: 6 lb 3.1 oz (2810 g) APGAR: 9, 9  Home with mother.  Everlene Other 09/23/2012, 7:33 AM  I have seen and examined this patient and agree the above assessment. CRESENZO-DISHMAN,Kreed Kauffman 09/23/2012 7:47 AM

## 2012-09-23 NOTE — Discharge Summary (Signed)
Attestation of Attending Supervision of Advanced Practitioner (PA/CNM/NP): Evaluation and management procedures were performed by the Advanced Practitioner under my supervision and collaboration.  I have reviewed the Advanced Practitioner's note and chart, and I agree with the management and plan.  Laken Lobato, MD, FACOG Attending Obstetrician & Gynecologist Faculty Practice, [facility]  

## 2012-09-28 ENCOUNTER — Inpatient Hospital Stay (HOSPITAL_COMMUNITY): Payer: BC Managed Care – PPO

## 2012-10-24 ENCOUNTER — Ambulatory Visit (INDEPENDENT_AMBULATORY_CARE_PROVIDER_SITE_OTHER): Payer: BC Managed Care – PPO | Admitting: Obstetrics & Gynecology

## 2012-10-24 ENCOUNTER — Encounter: Payer: Self-pay | Admitting: Obstetrics & Gynecology

## 2012-10-24 ENCOUNTER — Encounter: Payer: Self-pay | Admitting: Obstetrics and Gynecology

## 2012-10-24 NOTE — Patient Instructions (Addendum)
Cesarean Delivery  Care After  Refer to this sheet in the next few weeks. These instructions provide you with information on caring for yourself after your procedure. Your caregiver may also give you specific instructions. Your treatment has been planned according to current medical practices, but problems sometimes occur. Call your caregiver if you have any problems or questions after you go home.  HOME CARE INSTRUCTIONS   · Only take over-the-counter or prescription medicines as directed by your caregiver.  · Do not drink alcohol, especially if you are breastfeeding or taking medicine to relieve pain.  · Do not chew or smoke tobacco.  · Continue to use good perineal care. Good perineal care includes:  · Wiping your perineum from front to back.  · Keeping your perineum clean.  · Check your cut (incision) daily for increased redness, drainage, swelling, or separation of skin.  · Clean your incision gently with soap and water every day, and then pat it dry. If your caregiver says it is okay, leave the incision uncovered. Use a bandage (dressing) if the incision is draining fluid or appears irritated. If the adhesive strips across the incision do not fall off within 7 days, carefully peel them off.  · Hug a pillow when coughing or sneezing until your incision is healed. This helps to relieve pain.  · Do not use tampons or douche until your caregiver says it is okay.  · Shower, wash your hair, and take tub baths as directed by your caregiver.  · Wear a well-fitting bra that provides breast support.  · Limit wearing support panties or control-top hose.  · Drink enough fluids to keep your urine clear or pale yellow.  · Eat high-fiber foods such as whole grain cereals and breads, brown rice, beans, and fresh fruits and vegetables every day. These foods may help prevent or relieve constipation.  · Resume activities such as climbing stairs, driving, lifting, exercising, or traveling as directed by your caregiver.  · Talk to  your caregiver about resuming sexual activities. This is dependent upon your risk of infection, your rate of healing, and your comfort and desire to resume sexual activity.  · Try to have someone help you with your household activities and your newborn for at least a few days after you leave the hospital.  · Rest as much as possible. Try to rest or take a nap when your newborn is sleeping.  · Increase your activities gradually.  · Keep all of your scheduled postpartum appointments. It is very important to keep your scheduled follow-up appointments. At these appointments, your caregiver will be checking to make sure that you are healing physically and emotionally.  SEEK MEDICAL CARE IF:   · You are passing large clots from your vagina. Save any clots to show your caregiver.  · You have a foul smelling discharge from your vagina.  · You have trouble urinating.  · You are urinating frequently.  · You have pain when you urinate.  · You have a change in your bowel movements.  · You have increasing redness, pain, or swelling near your incision.  · You have pus draining from your incision.  · Your incision is separating.  · You have painful, hard, or reddened breasts.  · You have a severe headache.  · You have blurred vision or see spots.  · You feel sad or depressed.  · You have thoughts of hurting yourself or your newborn.  · You have questions about your care, the   care of your newborn, or medicines.  · You are dizzy or lightheaded.  · You have a rash.  · You have pain, redness, or swelling at the site of the removed intravenous access (IV) tube.  · You have nausea or vomiting.  · You stopped breastfeeding and have not had a menstrual period within 12 weeks of stopping.  · You are not breastfeeding and have not had a menstrual period within 12 weeks of delivery.  · You have a fever.  SEEK IMMEDIATE MEDICAL CARE IF:  · You have persistent pain.  · You have chest pain.  · You have shortness of breath.  · You faint.  · You  have leg pain.  · You have stomach pain.  · Your vaginal bleeding saturates 2 or more sanitary pads in 1 hour.  MAKE SURE YOU:   · Understand these instructions.  · Will watch your condition.  · Will get help right away if you are not doing well or get worse.  Document Released: 01/10/2002 Document Revised: 01/13/2012 Document Reviewed: 12/16/2011  Exit[employer]® Patient Information ©2014 [employer].

## 2012-10-24 NOTE — Progress Notes (Deleted)
Patient ID: Renee Williams, female   DOB: 1970-05-13, 42 y.o.   MRN: [id number] Postpartum Visit Patient is here for a postpartum visit. She is 4 weeks postpartum following a low cervical transverse Cesarean section. I have fully reviewed the prenatal and intrapartum course. The delivery was at *** gestational weeks. Outcome: primary cesarean section, low transverse incision. Anesthesia: spinal.  Postpartum course has been ***. Baby's course has been ***. Baby is feeding by {breast/bottle:69}. Bleeding {vag bleed:12292}. Bowel function is normal. Bladder function is normal. Patient is sexually active. Contraception method is tubal ligation. Postpartum depression screening: negative. Subjective:     Renee Williams is a 42 y.o. female who presents for a postpartum visit. She is 4 weeks postpartum following a low cervical transverse Cesarean section. I have fully reviewed the prenatal and intrapartum course. The delivery was at 39 gestational weeks. Outcome: repeat cesarean section, low transverse incision. Anesthesia: spinal. Postpartum course has been uncomplicated. Baby's course has been uncomplicated. Baby is feeding by breast and bottle.  Bleeding: spotting and passage of one large clot at 1 week postpartum. Bowel function is normal. Bladder function is normal. Patient is not sexually active. Contraception method is tubal ligation. Postpartum depression screening: negative.  The following portions of the patient's history were reviewed and updated as appropriate: allergies, current medications, past family history, past medical history, past social history, past surgical history and problem list.  Review of Systems Pertinent items are noted in HPI.   Objective:    BP 105/77  Pulse 96  Temp(Src) 98.7 F (37.1 C)  Ht 5\' 7"  (1.702 m)  Wt 97.478 kg (214 lb 14.4 oz)  BMI 33.65 kg/m2  Breastfeeding? Yes  General:  alert, cooperative and no distress   Breasts:  inspection negative, no nipple discharge or  bleeding, no masses or nodularity palpable  Lungs: clear to auscultation bilaterally  Heart:  regular rate and rhythm, S1, S2 normal, no murmur, click, rub or gallop  Abdomen: soft, non-tender; bowel sounds normal; no masses,  no organomegaly       Assessment:     4 weeks postpartum, doing well. Pap smear not done at today's visit.   Plan:    1. Contraception: tubal ligation 2. Resume daily activities, including bathing, lifting, and exercising. Provide note for patient to resume work at 8 weeks as requested. 3. Follow up as needed.

## 2012-10-24 NOTE — Progress Notes (Signed)
Patient ID: Renee Williams, female   DOB: 07/01/1970, 42 y.o.   MRN: [id number] Subjective:     Renee Williams is a 42 y.o. female who presents for a postpartum visit. She is 4 weeks postpartum following a low cervical transverse Cesarean section. I have fully reviewed the prenatal and intrapartum course. The delivery was at 39 gestational weeks. Outcome: repeat cesarean section, low transverse incision. Anesthesia: spinal. Postpartum course has been uncomplicated. Baby's course has been uncomplicated. Baby is feeding by breast and bottle.. Bleeding minimal vaginal spotting and passage of one large clot at 1 week postpartum. Bowel function is normal. Bladder function is normal. Patient is not sexually active. Contraception method is tubal ligation. Postpartum depression screening: negative.  The following portions of the patient's history were reviewed and updated as appropriate: allergies, current medications, past family history, past medical history, past social history, past surgical history and problem list.  Review of Systems Pertinent items are noted in HPI.   Objective:    BP 105/77  Pulse 96  Temp(Src) 98.7 F (37.1 C)  Ht 5\' 7"  (1.702 m)  Wt 97.478 kg (214 lb 14.4 oz)  BMI 33.65 kg/m2  Breastfeeding? Yes  General:  alert, cooperative and no distress   Breasts:  inspection negative, no nipple discharge or bleeding, no masses or nodularity palpable  Lungs: clear to auscultation bilaterally  Heart:  regular rate and rhythm, S1, S2 normal, no murmur, click, rub or gallop  Abdomen: soft, non-tender; bowel sounds normal; no masses,  no organomegaly       Assessment:     4 weeks postpartum, doing well. Pap smear not done at today's visit.   Plan:    1. Contraception: tubal ligation 2. Resume regular activities, including bathing and exercising. Provide note for patient to go back to work at 8 weeks postpartum. 3. Follow up as needed.

## 2012-12-07 ENCOUNTER — Encounter: Payer: Self-pay | Admitting: Obstetrics & Gynecology

## 2012-12-07 ENCOUNTER — Telehealth: Payer: Self-pay | Admitting: General Practice

## 2012-12-07 NOTE — Telephone Encounter (Signed)
Opened in error

## 2013-03-09 ENCOUNTER — Other Ambulatory Visit: Payer: Self-pay

## 2013-12-06 ENCOUNTER — Other Ambulatory Visit: Payer: Self-pay | Admitting: Nurse Practitioner

## 2013-12-06 DIAGNOSIS — N6320 Unspecified lump in the left breast, unspecified quadrant: Secondary | ICD-10-CM

## 2013-12-27 ENCOUNTER — Ambulatory Visit: Payer: BC Managed Care – PPO | Admitting: Obstetrics & Gynecology

## 2013-12-30 ENCOUNTER — Encounter (HOSPITAL_COMMUNITY): Payer: Self-pay | Admitting: Emergency Medicine

## 2013-12-30 ENCOUNTER — Emergency Department (HOSPITAL_COMMUNITY): Payer: BC Managed Care – PPO

## 2013-12-30 ENCOUNTER — Emergency Department (HOSPITAL_COMMUNITY)
Admission: EM | Admit: 2013-12-30 | Discharge: 2013-12-30 | Disposition: A | Discharge status: Home / Self Care | Payer: BC Managed Care – PPO | Attending: Emergency Medicine | Admitting: Emergency Medicine

## 2013-12-30 DIAGNOSIS — Z8679 Personal history of other diseases of the circulatory system: Secondary | ICD-10-CM | POA: Diagnosis not present

## 2013-12-30 DIAGNOSIS — M25559 Pain in unspecified hip: Secondary | ICD-10-CM | POA: Insufficient documentation

## 2013-12-30 DIAGNOSIS — Z3202 Encounter for pregnancy test, result negative: Secondary | ICD-10-CM | POA: Diagnosis not present

## 2013-12-30 DIAGNOSIS — N83209 Unspecified ovarian cyst, unspecified side: Secondary | ICD-10-CM | POA: Diagnosis not present

## 2013-12-30 DIAGNOSIS — Z8632 Personal history of gestational diabetes: Secondary | ICD-10-CM | POA: Insufficient documentation

## 2013-12-30 DIAGNOSIS — Z8719 Personal history of other diseases of the digestive system: Secondary | ICD-10-CM | POA: Diagnosis not present

## 2013-12-30 DIAGNOSIS — Z79899 Other long term (current) drug therapy: Secondary | ICD-10-CM | POA: Insufficient documentation

## 2013-12-30 DIAGNOSIS — Z791 Long term (current) use of non-steroidal anti-inflammatories (NSAID): Secondary | ICD-10-CM | POA: Diagnosis not present

## 2013-12-30 DIAGNOSIS — N83201 Unspecified ovarian cyst, right side: Secondary | ICD-10-CM

## 2013-12-30 LAB — URINALYSIS, ROUTINE W REFLEX MICROSCOPIC
BILIRUBIN URINE: NEGATIVE
GLUCOSE, UA: NEGATIVE mg/dL
HGB URINE DIPSTICK: NEGATIVE
Leukocytes, UA: NEGATIVE
Nitrite: NEGATIVE
PH: 8.5 — AB (ref 5.0–8.0)
Protein, ur: NEGATIVE mg/dL
Specific Gravity, Urine: 1.02 (ref 1.005–1.030)
Urobilinogen, UA: 1 mg/dL (ref 0.0–1.0)

## 2013-12-30 LAB — PREGNANCY, URINE: Preg Test, Ur: NEGATIVE

## 2013-12-30 MED ORDER — ONDANSETRON 8 MG PO TBDP
8.0000 mg | ORAL_TABLET | Freq: Once | ORAL | Status: AC
  Administered 2013-12-30: 8 mg via ORAL
  Filled 2013-12-30: qty 1

## 2013-12-30 MED ORDER — HYDROMORPHONE HCL PF 1 MG/ML IJ SOLN
1.0000 mg | Freq: Once | INTRAMUSCULAR | Status: AC
Start: 2013-12-30 — End: 2013-12-30
  Administered 2013-12-30: 1 mg via INTRAMUSCULAR
  Filled 2013-12-30: qty 1

## 2013-12-30 MED ORDER — NAPROXEN 250 MG PO TABS: 250.0000 mg | ORAL_TABLET | Freq: Two times a day (BID) | ORAL | Status: DC | PRN

## 2013-12-30 MED ORDER — OXYCODONE-ACETAMINOPHEN 5-325 MG PO TABS: ORAL_TABLET | ORAL | Status: DC

## 2013-12-30 MED ORDER — ONDANSETRON HCL 4 MG PO TABS: 4.0000 mg | ORAL_TABLET | Freq: Three times a day (TID) | ORAL | Status: DC | PRN

## 2013-12-30 MED ORDER — HYDROMORPHONE HCL PF 1 MG/ML IJ SOLN
1.0000 mg | Freq: Once | INTRAMUSCULAR | Status: AC
  Administered 2013-12-30: 1 mg via INTRAMUSCULAR
  Filled 2013-12-30: qty 1

## 2013-12-30 MED ORDER — KETOROLAC TROMETHAMINE 60 MG/2ML IM SOLN
60.0000 mg | Freq: Once | INTRAMUSCULAR | Status: AC
  Administered 2013-12-30: 60 mg via INTRAMUSCULAR
  Filled 2013-12-30: qty 2

## 2013-12-30 NOTE — ED Notes (Addendum)
Pt. Returned from ultrasound. Pt. Resting in bed.

## 2013-12-30 NOTE — Discharge Instructions (Signed)
°Emergency Department Resource Guide °1) Find a Doctor and Pay Out of Pocket °Although you won't have to find out who is covered by your insurance plan, it is a good idea to ask around and get recommendations. You will then need to call the office and see if the doctor you have chosen will accept you as a new patient and what types of options they offer for patients who are self-pay. Some doctors offer discounts or will set up payment plans for their patients who do not have insurance, but you will need to ask so you aren't surprised when you get to your appointment. ° °2) Contact Your Local Health Department °Not all health departments have doctors that can see patients for sick visits, but many do, so it is worth a call to see if yours does. If you don't know where your local health department is, you can check in your phone book. The CDC also has a tool to help you locate your state's health department, and many state websites also have listings of all of their local health departments. ° °3) Find a Walk-in Clinic °If your illness is not likely to be very severe or complicated, you may want to try a walk in clinic. These are popping up all over the country in pharmacies, drugstores, and shopping centers. They're usually staffed by nurse practitioners or physician assistants that have been trained to treat common illnesses and complaints. They're usually fairly quick and inexpensive. However, if you have serious medical issues or chronic medical problems, these are probably not your best option. ° °No Primary Care Doctor: °- Call [facility] at  832-8000 - they can help you locate a primary care doctor that  accepts your insurance, provides certain services, etc. °- Physician Referral Service- 1-800-533-3463 ° °Chronic Pain Problems: °Organization         Address  Phone   Notes  °[facility]  (336) 297-2271 Patients need to be referred by their primary care doctor.  ° °Medication  Assistance: °Organization         Address  Phone   Notes  °[place] [facility] [address] °[place], [place] [address] (336) 641-8030 --Must be a resident of [place] °-- Must have NO insurance coverage whatsoever (no Medicaid/ Medicare, etc.) °-- The pt. MUST have a primary care doctor that directs their care regularly and follows them in the community °  °[facility]  (866) 331-1348   °[employer]  (888) 892-1162   ° °Agencies that provide inexpensive medical care: °Organization         Address  Phone   Notes  °[facility]  (336) 832-8035   °[facility]    (336) 832-7272   °[facility] [address] °[place], [place] [address] (336) 832-4777   °[facility] [address], °[place] (336) 271-4999   °[facility]    (336) 373-0678   °[facility]    (336) 272-1050   °[facility] ° [address], [place] Phone:  (336) 832-4444, Fax:  (336) 832-4440 Hours of Operation:  9 am - 6 pm, M-F.  Also accepts Medicaid/Medicare and self-pay.  °[facility] ° [address], [place] Phone: (336) 832-3150, Fax: (336) [phone]. Hours of Operation:  8:30 am - 5:30 pm, M-F.  Also accepts Medicaid and self-pay.  °[facility] [place] [address], [place] Phone: (336) 878-6027   °[facility] [address], [place], [place] (336)723-1848, Ext. 123 Mondays & Thursdays: 7-9 AM.  First 15 patients are seen on a first come, first serve basis. °  ° °Medicaid-accepting [place] Providers: ° °Organization         Address  Phone   Notes  °[facility] [address], [place] (336) 641-2100 Also accepts self-pay patients.  °[facility] [address], [place] ° (336) 856-9996   °[facility] [address], [place]  (336) 288-8857   °[facility] [address] [place] [address], [place] (336) 299-7000   °Veita Bland [address], [place]  ° (336) 373-1557 Only accepts [place] Access Medicaid patients after they have their name applied to their card.  ° °Self-Pay (no insurance) in [place]: ° °Organization         Address  Phone   Notes  °Sickle Cell Patients, [facility] [address], [place] (336) 832-1970   °[facility] [address], [place] (336) 832-4400   °[facility] [place] ° [address] [place] [address], [place] (336) 992-4800   °[facility]/Dr. Osei-Bonsu ° [address] [place] [address], [place] or [address], [place] (336) 841-8500 Phone number for both [place] and [place] locations is the same.  °[facility] [address], [place] (336) 299-0000   °[facility] [address] [place] [address], [place] or [address] (336) 852-7530 °(336) 878-2260   °[facility] [address], [place] (336) 350-1642, phone; (336) 294-5005, fax Sees patients 1st and 3rd Saturday of every month.  Must not qualify for public or private insurance (i.e. Medicaid, Medicare, [place] Health Choice, Veterans' Benefits) • Household income should be no more than 200% of the poverty level •The clinic cannot treat you if you are pregnant or think you are pregnant • Sexually transmitted diseases are not treated at the clinic.  ° ° °Dental Care: °Organization         Address  Phone  Notes  °[place] [facility] [address], [place] (336) 641-6152 Accepts children up to age 21 who are enrolled in Medicaid or [place] Health Choice; pregnant women with a Medicaid card; and children who have applied for Medicaid or [place] Health Choice, but were declined, whose parents can pay a reduced fee at time of service.  °[place]  [facility] [place]  [address], [place] (336) 641-7733 Accepts children up to age 21 who are enrolled in Medicaid or [place] Health Choice; pregnant women with a Medicaid card; and children who have applied for Medicaid or [place] Health Choice, but were declined, whose parents can pay a reduced fee at time of service.  °[facility] ° [address], [place] (336) 641-4533 Patients are seen by appointment only. Walk-ins are not accepted. [facility] will see patients 18 years of age and older. °Monday - Tuesday (8am-5pm) °Most Wednesdays (8:30-5pm) °$30 per visit, cash only  °[facility] ° [address], [place] (336) 641-4533 Patients are seen by appointment only. Walk-ins are not accepted. [facility] will see patients 18 years of age and older. °One   Wednesday Evening (Monthly: Volunteer Based).  $30 per visit, cash only  °[facility]  (919) 537-3737 for adults; Children under age 4, call Graduate Pediatric Dentistry at ([phone]) [phone]. Children aged 4-14, please call ([phone]) [phone] to request a pediatric application. ° Dental services are provided in all areas of dental care including fillings, crowns and bridges, complete and partial dentures, implants, gum treatment, root canals, and extractions. Preventive care is also provided. Treatment is provided to both adults and children. °Patients are selected via a lottery and there is often a waiting list. °  °[facility] [address], °[place] ° (336) 763-8833 [email or web address] °  °[facility] [address], [place], [place] (336)723-1848, Ext. 123 Second and Fourth Thursday of each month, opens at 6:30 AM; Clinic ends at 9 AM.  Patients are seen on a first-come first-served basis, and a limited number are seen during each clinic.  ° °[facility] ° [address], [place], [place] (336) 723-7904    Eligibility Requirements °You must have lived in [place], [place], or [place] counties for at least the last three months. °  You cannot be eligible for state or federal sponsored healthcare insurance, including Veterans Administration, Medicaid, or Medicare. °  You generally cannot be eligible for healthcare insurance through your employer.  °  How to apply: °Eligibility screenings are held every Tuesday and Wednesday afternoon from 1:00 pm until 4:00 pm. You do not need an appointment for the interview!  °[facility] [address], [place], [place] 336-631-2330   °[facility]  336-342-8273   °[facility]  336-703-3100   °[facility]  336-570-6415   ° °Behavioral Health Resources in the Community: °Intensive Outpatient Programs °Organization         Address  Phone  Notes  °[facility] [address], [place], [place] 336-878-6098   °[facility] [address], [place], [place] 336-832-9800   °[facility] [address], [place], [place] ° 336-882-2125   °[facility] [address],  °[place], [place] 1-800-853-5163 or 336-641-4981   °Substance Abuse Resources °Organization         Address  Phone  Notes  °[facility]  336-882-2125   °[facility]  336-784-9470   °[facility]  336-285-9073   °[facility]  336-845-3988   °Residential & Outpatient Substance Abuse Program  1-800-659-3381   °Psychological Services °Organization         Address  Phone  Notes  °[facility]  336- 832-9600   °[facility]  336- 378-7881   °[facility] [address], [place] 1-800-853-5163 or 336-641-4981   ° °Mobile Crisis Teams °Organization         Address  Phone  Notes  °[facility], Mobile Crisis Care Unit  1-877-626-1772   °Assertive °[facility] ° [address]  [place], [place] 336-834-9664   °Sharon DeEsch [address] °[place] 336-554-5454   ° °Self-Help/Support Groups °Organization         Address  Phone             Notes  °[facility]  - variety of support groups  336- 373-1402 Call for more information  °Narcotics Anonymous (NA), [facility] [address], °[place]  2 meetings at this location  ° °  Residential Treatment Programs Organization         Address  Phone  Notes  [facility] [address],    [place]  1-(717)162-1383   [facility]  [address], [place] [address], [place], [place]   [place], [place] [phone] Admissions: 8am-3pm M-F  Incent[facility] Substance [place] [facility] [address],    [place], [place] 710-626-9485   [facility] [address] [place], [place], [place]   [facility] [address],  [place], [place]   [facility] Intensive Outpatient [address], [place], [place]   [facility] [address] [place]  [place], [place] 1-915 071 9792 or 854-144-7582   [facility] ([facility] [address] [place], [place] Accepts Medicaid  [facility] [place] [address]  [place] 1-435-273-9287 Substance Abuse/Addiction Treatment   [facility]         Address  Phone  Notes  [facility]  202-567-7894   Domenic Schwab, PhD [address] [place], [place]   810-280-2005 or (210)[phone]   [place], [place] [phone]   [facility] [address] [place], [place] [phone] Insurance/Medicaid/sponsorship through [facility] and Families [address]                                    [place], [place] [phone] [place] [address], [place] [phone]    Dr. Adele Schilder  [phone]   [facility] Dept. 1) [address], [place] 2) [place] 3)  [place], [place] [phone]  [phone]   [place] ([phone] or [phone] (After Hours)       Your pelvic ultrasound revealed: "A 4.5 x 3.9 x 7.1 cm minimally complicated benign appearing cyst within the posterior pelvis -likely originating from the right ovary although no definite normal right ovarian tissue is identified. Short-interval follow up ultrasound in 6-12 weeks is recommended, preferably during the week following the patient's normal menses."  Take the prescriptions as directed.  Apply moist heat or ice to the area(s) of discomfort, for 15 minutes at a time, several times per day for the next few days.  Do not fall asleep on a heating or ice pack.  Call your regular OB/GYN doctor on Monday to schedule a follow up appointment this week to follow up this ultrasound finding.  Return to the Emergency Department immediately if worsening.

## 2013-12-30 NOTE — ED Notes (Signed)
Dr. McManus at bedside. 

## 2013-12-30 NOTE — ED Notes (Signed)
Pt. At ultrasound

## 2013-12-30 NOTE — ED Notes (Signed)
Right hip pain with nausea and vomiting

## 2013-12-30 NOTE — ED Provider Notes (Signed)
CSN: [id number]     Arrival date & time 12/30/13  1721 History   First MD Initiated Contact with Patient 12/30/13 1845     Chief Complaint  Patient presents with  . Hip Pain      HPI Pt was seen at 1850. Per pt, c/o gradual onset and persistence of constant right groin "pain" that began this morning. Has been associated with multiple intermittent episodes of N/V. Pt describes the pain as "aching." States she has had similar pain during her last pregnancy, "but not this bad." Pt states last week she had right sided low back "pain" which has since resolved. Denies diarrhea, no abd pain, no fevers, no rash, no dysuria/hematuria, no vaginal bleeding/discharge, no injury. Denies incont/retention of bowel or bladder, no saddle anesthesia, no focal motor weakness, no tingling/numbness in extremities.    [facility] Past Medical History  Diagnosis Date  . HELLP syndrome 2009  . Pregnancy induced hypertension   . Gallstones   . Gestational diabetes     Diet controlled   Past Surgical History  Procedure Laterality Date  . Cesarean section    . Wisdom tooth extraction    . Cesarean section with bilateral tubal ligation N/A 09/21/2012    Procedure: CESAREAN SECTION WITH BILATERAL TUBAL LIGATION;  Surgeon: Woodroe Mode, MD;  Location: [facility];  Service: Obstetrics;  Laterality: N/A;   Family History  Problem Relation Age of Onset  . Diabetes Maternal Aunt   . Stroke Maternal Aunt   . Diabetes Maternal Uncle   . Diabetes Maternal Grandmother   . Hypertension Maternal Grandmother   . Cancer Maternal Grandfather     Lung  . Diabetes Paternal Grandmother    History  Substance Use Topics  . Smoking status: Never Smoker   . Smokeless tobacco: Never Used  . Alcohol Use: No   OB History   Grav Para Term Preterm Abortions TAB SAB Ect Mult Living   3 2 2  1  1   2      Review of Systems ROS: Statement: All systems negative except as marked or noted in the HPI;  Constitutional: Negative for fever and chills. ; ; Eyes: Negative for eye pain, redness and discharge. ; ; ENMT: Negative for ear pain, hoarseness, nasal congestion, sinus pressure and sore throat. ; ; Cardiovascular: Negative for chest pain, palpitations, diaphoresis, dyspnea and peripheral edema. ; ; Respiratory: Negative for cough, wheezing and stridor. ; ; Gastrointestinal: +N/V. Negative for diarrhea, abdominal pain, blood in stool, hematemesis, jaundice and rectal bleeding. . ; ; Genitourinary: Negative for dysuria, flank pain and hematuria. ; ; GYN:  No vaginal bleeding, no vaginal discharge, no vulvar pain. ;; Musculoskeletal: +right low back and hip/groin pain. Negative for neck pain. Negative for swelling and trauma.; ; Skin: Negative for pruritus, rash, abrasions, blisters, bruising and skin lesion.; ; Neuro: Negative for headache, lightheadedness and neck stiffness. Negative for weakness, altered level of consciousness , altered mental status, extremity weakness, paresthesias, involuntary movement, seizure and syncope.      Allergies  Review of patient's allergies indicates no known allergies.  Home Medications   Prior to Admission medications   Medication Sig Start Date End Date Taking? Authorizing Provider  buPROPion (WELLBUTRIN) 100 MG tablet Take 100 mg by mouth 2 (two) times daily. 12/29/13  Yes Historical Provider, MD  naproxen sodium (ALEVE) 220 MG tablet Take 220 mg by mouth 2 (two) times daily with a meal.   Yes Historical Provider, MD  calcium carbonate (TUMS - DOSED IN MG ELEMENTAL CALCIUM) 500 MG chewable tablet Chew 2 tablets by mouth as needed for heartburn.    Historical Provider, MD   BP 116/75  Pulse 82  Temp(Src) 98.8 F (37.1 C) (Oral)  Resp 18  SpO2 100%  LMP 12/07/2013 Physical Exam 1855: Physical examination:  Nursing notes reviewed; Vital signs and O2 SAT reviewed;  Constitutional: Well developed, Well nourished, Well hydrated, Uncomfortable appearing.; Head:   Normocephalic, atraumatic; Eyes: EOMI, PERRL, No scleral icterus; ENMT: Mouth and pharynx normal, Mucous membranes moist; Neck: Supple, Full range of motion, No lymphadenopathy; Cardiovascular: Regular rate and rhythm, No murmur, rub, or gallop; Respiratory: Breath sounds clear & equal bilaterally, No rales, rhonchi, wheezes.  Speaking full sentences with ease, Normal respiratory effort/excursion; Chest: Nontender, Movement normal; Abdomen: Soft, Nontender, Nondistended, Normal bowel sounds; Genitourinary: No CVA tenderness; Spine:  No midline CS, TS, LS tenderness.;; Extremities: Pulses normal, Pelvis stable. Right hip NT, no rash, FROM without tenderness. NT right knee/ankle/foot. No edema, No calf edema or asymmetry.; Neuro: AA&Ox3, Major CN grossly intact.  Speech clear. No gross focal motor or sensory deficits in extremities.; Skin: Color normal, Warm, Dry.   ED Course  Procedures     MDM  MDM Reviewed: previous chart, nursing note and vitals Interpretation: labs, x-ray, CT scan and ultrasound    Results for orders placed during the hospital encounter of 12/30/13  URINALYSIS, ROUTINE W REFLEX MICROSCOPIC      Result Value Ref Range   Color, Urine AMBER (*) YELLOW   APPearance CLOUDY (*) CLEAR   Specific Gravity, Urine 1.020  1.005 - 1.030   pH 8.5 (*) 5.0 - 8.0   Glucose, UA NEGATIVE  NEGATIVE mg/dL   Hgb urine dipstick NEGATIVE  NEGATIVE   Bilirubin Urine NEGATIVE  NEGATIVE   Ketones, ur TRACE (*) NEGATIVE mg/dL   Protein, ur NEGATIVE  NEGATIVE mg/dL   Urobilinogen, UA 1.0  0.0 - 1.0 mg/dL   Nitrite NEGATIVE  NEGATIVE   Leukocytes, UA NEGATIVE  NEGATIVE  PREGNANCY, URINE      Result Value Ref Range   Preg Test, Ur NEGATIVE  NEGATIVE   Ct Abdomen Pelvis Wo Contrast 12/30/2013   CLINICAL DATA:  43 year old female with right flank and abdominal pain.  EXAM: CT ABDOMEN AND PELVIS WITHOUT CONTRAST  TECHNIQUE: Multidetector CT imaging of the abdomen and pelvis was performed  following the standard protocol without IV contrast.  COMPARISON:  None.  FINDINGS: The liver, adrenal glands, pancreas and kidneys are unremarkable. There is no evidence of urinary calculi or hydronephrosis.  Moderate splenomegaly is identified with splenic volume of 900 cc. No definite focal splenic lesions identified.  Multiple gallstones are noted all measuring 1-1.5 cm. No CT evidence of acute cholecystitis noted.  Please note that parenchymal abnormalities may be missed without intravenous contrast.  The bowel, bladder and appendix are unremarkable except for a small hiatal hernia. There is no evidence of bowel obstruction, abscess or pneumoperitoneum.  A 5.5 x 8 cm cystic structure in the posterior pelvis may be adnexal/ovarian.  No acute or suspicious bony abnormalities are identified.  IMPRESSION: 5.5 x 8 cm posterior adnexal cystic structure -pelvic ultrasound is recommended for further evaluation.  Moderate splenomegaly.  Cholelithiasis without CT evidence of acute cholecystitis.  No evidence of calculi or hydronephrosis.   Electronically Signed   By: Hassan Rowan M.D.   On: 12/30/2013 20:14   Dg Hip Complete Right 12/30/2013   CLINICAL DATA:  Right-sided  hip pain.  EXAM: RIGHT HIP - COMPLETE 2+ VIEW  COMPARISON:  None.  FINDINGS: The right hip is located. No acute bone or soft tissue abnormality is present. The bony pelvis is unremarkable. Surgical clips are compatible with tubal ligation.  IMPRESSION: Negative right hip radiographs.   Electronically Signed   By: Lawrence Santiago M.D.   On: 12/30/2013 18:32   [place] Art/ven Flow Abd Pelv Doppler 12/30/2013   CLINICAL DATA:  43 year old female with pelvic pain and cystic abnormality identified on recent CT.  EXAM: TRANSABDOMINAL AND TRANSVAGINAL ULTRASOUND OF PELVIS  DOPPLER ULTRASOUND OF OVARIES  TECHNIQUE: Both transabdominal and transvaginal ultrasound examinations of the pelvis were performed. Transabdominal technique was performed for global imaging of the  pelvis including uterus, ovaries, adnexal regions, and pelvic cul-de-sac.  It was necessary to proceed with endovaginal exam following the transabdominal exam to visualize the ovaries and endometrium. Color and duplex Doppler ultrasound was utilized to evaluate blood flow to the ovaries.  COMPARISON:  12/30/2013 CT  FINDINGS: Uterus  Measurements: Anteverted measuring 9.3 x 4.5 x 6.0 cm. No fibroids or other mass visualized.  Endometrium  Thickness: 13 mm.  No focal abnormality visualized.  Right ovary  Not visualized.  Left ovary  Measurements: 2.2 x 2.1 x 2.5 cm. Normal appearance/no adnexal mass.  Pulsed Doppler evaluation of the left ovary demonstrates normal low-resistance arterial and venous waveforms.  Other findings  A 4.5 x 3.9 x 7.1 cm minimally complicated cystic structure within the posterior pelvis is likely originating from the the right ovary, although no definite normal right ovarian tissue is identified.  A small amount free fluid within pelvis is noted.  There is no evidence of solid adnexal mass.  IMPRESSION: 4.5 x 3.9 x 7.1 cm minimally complicated benign appearing cyst within the posterior pelvis -likely originating from the right ovary although no definite normal right ovarian tissue is identified. Short-interval follow up ultrasound in 6-12 weeks is recommended, preferably during the week following the patient's normal menses.  Unremarkable left ovary.  No evidence of left ovarian torsion.  Small amount of free pelvic fluid.   Electronically Signed   By: Hassan Rowan M.D.   On: 12/30/2013 21:55    2205:  Pt feels better after meds and wants to go home now. Workup reveals ovarian cyst as likely cause for pain at this time. Will tx symptomatically at this time, f/u OB/GYN MD. Dx and testing d/w pt and family.  Questions answered.  Verb understanding, agreeable to d/c home with outpt f/u.   Francine Graven, DO 01/01/14 [phone]

## 2013-12-30 NOTE — ED Notes (Signed)
Reported that pt c/o nausea

## 2014-01-01 ENCOUNTER — Inpatient Hospital Stay (HOSPITAL_COMMUNITY)
Admission: AD | Admit: 2014-01-01 | Discharge: 2014-01-01 | Disposition: A | Discharge status: Home / Self Care | Payer: BC Managed Care – PPO | Source: Ambulatory Visit | Attending: Obstetrics and Gynecology | Admitting: Obstetrics and Gynecology

## 2014-01-01 ENCOUNTER — Encounter (HOSPITAL_COMMUNITY): Payer: Self-pay | Admitting: *Deleted

## 2014-01-01 DIAGNOSIS — R1031 Right lower quadrant pain: Secondary | ICD-10-CM | POA: Diagnosis present

## 2014-01-01 DIAGNOSIS — N83209 Unspecified ovarian cyst, unspecified side: Secondary | ICD-10-CM

## 2014-01-01 DIAGNOSIS — N83201 Unspecified ovarian cyst, right side: Secondary | ICD-10-CM

## 2014-01-01 LAB — URINALYSIS, ROUTINE W REFLEX MICROSCOPIC
BILIRUBIN URINE: NEGATIVE
GLUCOSE, UA: NEGATIVE mg/dL
Hgb urine dipstick: NEGATIVE
KETONES UR: NEGATIVE mg/dL
LEUKOCYTES UA: NEGATIVE
Nitrite: NEGATIVE
PROTEIN: NEGATIVE mg/dL
Specific Gravity, Urine: 1.02 (ref 1.005–1.030)
Urobilinogen, UA: 0.2 mg/dL (ref 0.0–1.0)
pH: 6 (ref 5.0–8.0)

## 2014-01-01 MED ORDER — KETOROLAC TROMETHAMINE 60 MG/2ML IM SOLN: 60.0000 mg | INTRAMUSCULAR | Status: DC

## 2014-01-01 NOTE — MAU Provider Note (Signed)
Chief Complaint: Abdominal Pain   First Provider Initiated Contact with Patient 01/01/14 1543     SUBJECTIVE HPI: Renee Williams is a 43 y.o. [id number] who presents to maternity admissions reporting right ovarian cyst diagnosed 8/29 at [facility].  On 8/29 she presented to the ED with severe RLQ pain and n/v. She was discharged with Percocet and Zofran.  She presents to MAU today because she cannot work while taking the pain medication and when she takes it she still has pain.  She requests surgery or needle aspiration "anything to make this pain go away".  She has appt in [facility] in October for follow up.  Patient's last menstrual period was 12/07/2013.  She denies vaginal itching/burning, urinary symptoms, h/a, dizziness, or fever/chills.    Past Medical History  Diagnosis Date  . HELLP syndrome 2009  . Pregnancy induced hypertension   . Gallstones   . Gestational diabetes     Diet controlled   Past Surgical History  Procedure Laterality Date  . Cesarean section    . Wisdom tooth extraction    . Cesarean section with bilateral tubal ligation N/A 09/21/2012    Procedure: CESAREAN SECTION WITH BILATERAL TUBAL LIGATION;  Surgeon: Woodroe Mode, MD;  Location: [facility];  Service: Obstetrics;  Laterality: N/A;   History   Social History  . Marital Status: Married    Spouse Name: N/A    Number of Children: N/A  . Years of Education: N/A   Occupational History  . Not on file.   Social History Main Topics  . Smoking status: Never Smoker   . Smokeless tobacco: Never Used  . Alcohol Use: No  . Drug Use: No  . Sexual Activity: Yes    Birth Control/ Protection: Other-see comments   Other Topics Concern  . Not on file   Social History Narrative  . No narrative on file   No current facility-administered medications on file prior to encounter.   Current Outpatient Prescriptions on File Prior to Encounter  Medication Sig Dispense Refill  . buPROPion (WELLBUTRIN) 100 MG tablet Take 100 mg by  mouth 2 (two) times daily.      . naproxen sodium (ALEVE) 220 MG tablet Take 220 mg by mouth 2 (two) times daily with a meal.      . ondansetron (ZOFRAN) 4 MG tablet Take 1 tablet (4 mg total) by mouth every 8 (eight) hours as needed for nausea or vomiting.  6 tablet  0  . oxyCODONE-acetaminophen (PERCOCET/ROXICET) 5-325 MG per tablet 1 or 2 tabs PO q6h prn pain  25 tablet  0   No Known Allergies  ROS: Pertinent items in HPI  OBJECTIVE Blood pressure 100/60, pulse 79, temperature 97.8 F (36.6 C), temperature source Oral, resp. rate 18, last menstrual period 12/07/2013, currently breastfeeding. GENERAL: Well-developed, well-nourished female in no acute distress.  HEENT: Normocephalic HEART: normal rate RESP: normal effort ABDOMEN: Soft, tender RLQ, no guarding, no rebound tenderness EXTREMITIES: Nontender, no edema NEURO: Alert and oriented SPECULUM EXAM: Deferred  LAB RESULTS Results for orders placed during the hospital encounter of 01/01/14 (from the past 24 hour(s))  URINALYSIS, ROUTINE W REFLEX MICROSCOPIC     Status: None   Collection Time    01/01/14 12:59 PM      Result Value Ref Range   Color, Urine YELLOW  YELLOW   APPearance CLEAR  CLEAR   Specific Gravity, Urine 1.020  1.005 - 1.030   pH 6.0  5.0 - 8.0  Glucose, UA NEGATIVE  NEGATIVE mg/dL   Hgb urine dipstick NEGATIVE  NEGATIVE   Bilirubin Urine NEGATIVE  NEGATIVE   Ketones, ur NEGATIVE  NEGATIVE mg/dL   Protein, ur NEGATIVE  NEGATIVE mg/dL   Urobilinogen, UA 0.2  0.0 - 1.0 mg/dL   Nitrite NEGATIVE  NEGATIVE   Leukocytes, UA NEGATIVE  NEGATIVE    IMAGING Ct Abdomen Pelvis Wo Contrast  12/30/2013   CLINICAL DATA:  43 year old female with right flank and abdominal pain.  EXAM: CT ABDOMEN AND PELVIS WITHOUT CONTRAST  TECHNIQUE: Multidetector CT imaging of the abdomen and pelvis was performed following the standard protocol without IV contrast.  COMPARISON:  None.  FINDINGS: The liver, adrenal glands, pancreas and  kidneys are unremarkable. There is no evidence of urinary calculi or hydronephrosis.  Moderate splenomegaly is identified with splenic volume of 900 cc. No definite focal splenic lesions identified.  Multiple gallstones are noted all measuring 1-1.5 cm. No CT evidence of acute cholecystitis noted.  Please note that parenchymal abnormalities may be missed without intravenous contrast.  The bowel, bladder and appendix are unremarkable except for a small hiatal hernia. There is no evidence of bowel obstruction, abscess or pneumoperitoneum.  A 5.5 x 8 cm cystic structure in the posterior pelvis may be adnexal/ovarian.  No acute or suspicious bony abnormalities are identified.  IMPRESSION: 5.5 x 8 cm posterior adnexal cystic structure -pelvic ultrasound is recommended for further evaluation.  Moderate splenomegaly.  Cholelithiasis without CT evidence of acute cholecystitis.  No evidence of calculi or hydronephrosis.   Electronically Signed   By: Hassan Rowan M.D.   On: 12/30/2013 20:14   Dg Hip Complete Right  12/30/2013   CLINICAL DATA:  Right-sided hip pain.  EXAM: RIGHT HIP - COMPLETE 2+ VIEW  COMPARISON:  None.  FINDINGS: The right hip is located. No acute bone or soft tissue abnormality is present. The bony pelvis is unremarkable. Surgical clips are compatible with tubal ligation.  IMPRESSION: Negative right hip radiographs.   Electronically Signed   By: Lawrence Santiago M.D.   On: 12/30/2013 18:32   US Transvaginal Non-ob  12/30/2013   CLINICAL DATA:  43 year old female with pelvic pain and cystic abnormality identified on recent CT.  EXAM: TRANSABDOMINAL AND TRANSVAGINAL ULTRASOUND OF PELVIS  DOPPLER ULTRASOUND OF OVARIES  TECHNIQUE: Both transabdominal and transvaginal ultrasound examinations of the pelvis were performed. Transabdominal technique was performed for global imaging of the pelvis including uterus, ovaries, adnexal regions, and pelvic cul-de-sac.  It was necessary to proceed with endovaginal exam  following the transabdominal exam to visualize the ovaries and endometrium. Color and duplex Doppler ultrasound was utilized to evaluate blood flow to the ovaries.  COMPARISON:  12/30/2013 CT  FINDINGS: Uterus  Measurements: Anteverted measuring 9.3 x 4.5 x 6.0 cm. No fibroids or other mass visualized.  Endometrium  Thickness: 13 mm.  No focal abnormality visualized.  Right ovary  Not visualized.  Left ovary  Measurements: 2.2 x 2.1 x 2.5 cm. Normal appearance/no adnexal mass.  Pulsed Doppler evaluation of the left ovary demonstrates normal low-resistance arterial and venous waveforms.  Other findings  A 4.5 x 3.9 x 7.1 cm minimally complicated cystic structure within the posterior pelvis is likely originating from the the right ovary, although no definite normal right ovarian tissue is identified.  A small amount free fluid within pelvis is noted.  There is no evidence of solid adnexal mass.  IMPRESSION: 4.5 x 3.9 x 7.1 cm minimally complicated benign appearing cyst within  the posterior pelvis -likely originating from the right ovary although no definite normal right ovarian tissue is identified. Short-interval follow up ultrasound in 6-12 weeks is recommended, preferably during the week following the patient's normal menses.  Unremarkable left ovary.  No evidence of left ovarian torsion.  Small amount of free pelvic fluid.   Electronically Signed   By: Hassan Rowan M.D.   On: 12/30/2013 21:55    ASSESSMENT 1. Right ovarian cyst     PLAN Discharge home Reviewed U/S and CT findings with pt. Offered Toradol in MAU, and letter for pt to miss work 2-3 days, but pt had to leave to take care of child Recommend Aleve BID in addition to Percocet for pain List of Gyn providers given so pt can make earlier appt for follow up    Medication List         ALEVE 220 MG tablet  Generic drug:  naproxen sodium  Take 220 mg by mouth 2 (two) times daily with a meal.     buPROPion 100 MG tablet  Commonly known as:   WELLBUTRIN  Take 100 mg by mouth 2 (two) times daily.     ondansetron 4 MG tablet  Commonly known as:  ZOFRAN  Take 1 tablet (4 mg total) by mouth every 8 (eight) hours as needed for nausea or vomiting.     oxyCODONE-acetaminophen 5-325 MG per tablet  Commonly known as:  PERCOCET/ROXICET  1 or 2 tabs PO q6h prn pain     prenatal multivitamin Tabs tablet  Take 1 tablet by mouth daily at 12 noon.       Follow-up Information   Follow up with [facility]. (As scheduled)    Specialty:  Obstetrics and Gynecology   Contact information:   [place] [address] [phone]      Follow up with [facility]. (As needed for emergencies)    Contact information:   [id number] [place] [address] [phone]      Fatima Blank Certified Nurse-Midwife 01/01/2014  4:36 PM

## 2014-01-01 NOTE — MAU Note (Signed)
Pt states that she was dx with an ovarian cyst on Saturday the 29th. Went home with Percocet and Zofran. 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 She would like to know if she can do surgery or aspirate the cyst.

## 2014-01-01 NOTE — Discharge Instructions (Signed)
Ovarian Cyst An ovarian cyst is a fluid-filled sac that forms on an ovary. The ovaries are small organs that produce eggs in women. Various types of cysts can form on the ovaries. Most are not cancerous. Many do not cause problems, and they often go away on their own. Some may cause symptoms and require treatment. Common types of ovarian cysts include:  Functional cysts--These cysts may occur every month during the menstrual cycle. This is normal. The cysts usually go away with the next menstrual cycle if the woman does not get pregnant. Usually, there are no symptoms with a functional cyst.  Endometrioma cysts--These cysts form from the tissue that lines the uterus. They are also called "chocolate cysts" because they become filled with blood that turns brown. This type of cyst can cause pain in the lower abdomen during intercourse and with your menstrual period.  Cystadenoma cysts--This type develops from the cells on the outside of the ovary. These cysts can get very big and cause lower abdomen pain and pain with intercourse. This type of cyst can twist on itself, cut off its blood supply, and cause severe pain. It can also easily rupture and cause a lot of pain.  Dermoid cysts--This type of cyst is sometimes found in both ovaries. These cysts may contain different kinds of body tissue, such as skin, teeth, hair, or cartilage. They usually do not cause symptoms unless they get very big.  Theca lutein cysts--These cysts occur when too much of a certain hormone (human chorionic gonadotropin) is produced and overstimulates the ovaries to produce an egg. This is most common after procedures used to assist with the conception of a baby (in vitro fertilization). CAUSES   Fertility drugs can cause a condition in which multiple large cysts are formed on the ovaries. This is called ovarian hyperstimulation syndrome.  A condition called polycystic ovary syndrome can cause hormonal imbalances that can lead to  nonfunctional ovarian cysts. SIGNS AND SYMPTOMS  Many ovarian cysts do not cause symptoms. If symptoms are present, they may include:  Pelvic pain or pressure.  Pain in the lower abdomen.  Pain during sexual intercourse.  Increasing girth (swelling) of the abdomen.  Abnormal menstrual periods.  Increasing pain with menstrual periods.  Stopping having menstrual periods without being pregnant. DIAGNOSIS  These cysts are commonly found during a routine or annual pelvic exam. Tests may be ordered to find out more about the cyst. These tests may include:  Ultrasound.  X-ray of the pelvis.  CT scan.  MRI.  Blood tests. TREATMENT  Many ovarian cysts go away on their own without treatment. Your health care provider may want to check your cyst regularly for 2-3 months to see if it changes. For women in menopause, it is particularly important to monitor a cyst closely because of the higher rate of ovarian cancer in menopausal women. When treatment is needed, it may include any of the following:  A procedure to drain the cyst (aspiration). This may be done using a long needle and ultrasound. It can also be done through a laparoscopic procedure. This involves using a thin, lighted tube with a tiny camera on the end (laparoscope) inserted through a small incision.  Surgery to remove the whole cyst. This may be done using laparoscopic surgery or an open surgery involving a larger incision in the lower abdomen.  Hormone treatment or birth control pills. These methods are sometimes used to help dissolve a cyst. HOME CARE INSTRUCTIONS   Only take over-the-counter   or prescription medicines as directed by your health care provider.  Follow up with your health care provider as directed.  Get regular pelvic exams and Pap tests. SEEK MEDICAL CARE IF:   Your periods are late, irregular, or painful, or they stop.  Your pelvic pain or abdominal pain does not go away.  Your abdomen becomes  larger or swollen.  You have pressure on your bladder or trouble emptying your bladder completely.  You have pain during sexual intercourse.  You have feelings of fullness, pressure, or discomfort in your stomach.  You lose weight for no apparent reason.  You feel generally ill.  You become constipated.  You lose your appetite.  You develop acne.  You have an increase in body and facial hair.  You are gaining weight, without changing your exercise and eating habits.  You think you are pregnant. SEEK IMMEDIATE MEDICAL CARE IF:   You have increasing abdominal pain.  You feel sick to your stomach (nauseous), and you throw up (vomit).  You develop a fever that comes on suddenly.  You have abdominal pain during a bowel movement.  Your menstrual periods become heavier than usual. MAKE SURE YOU:  Understand these instructions.  Will watch your condition.  Will get help right away if you are not doing well or get worse. Document Released: 04/20/2005 Document Revised: 04/25/2013 Document Reviewed: 12/26/2012 ExitCare Patient Information 2015 [employer]. This information is not intended to replace advice given to you by your health care provider. Make sure you discuss any questions you have with your health care provider.  

## 2014-01-01 NOTE — MAU Note (Signed)
Was seen @ [facility] Saturday night for severe RLQ pain, dx'd with ovarian cyst.  Was given percocet, but still having pain, also feels drugged up.  Cannot get appt in GYN clinic until October.

## 2014-01-02 NOTE — MAU Provider Note (Signed)
Attestation of Attending Supervision of Advanced Practitioner (CNM/NP): Evaluation and management procedures were performed by the Advanced Practitioner under my supervision and collaboration.  I have reviewed the Advanced Practitioner's note and chart, and I agree with the management and plan.  Bellarose Burtt 01/02/2014 7:22 AM

## 2014-01-10 ENCOUNTER — Ambulatory Visit: Payer: BC Managed Care – PPO | Admitting: Obstetrics & Gynecology

## 2014-01-18 ENCOUNTER — Other Ambulatory Visit (HOSPITAL_COMMUNITY)
Admission: RE | Admit: 2014-01-18 | Discharge: 2014-01-18 | Disposition: A | Discharge status: Home / Self Care | Payer: BC Managed Care – PPO | Source: Ambulatory Visit | Attending: Obstetrics & Gynecology | Admitting: Obstetrics & Gynecology

## 2014-01-18 ENCOUNTER — Encounter: Payer: Self-pay | Admitting: Obstetrics & Gynecology

## 2014-01-18 ENCOUNTER — Ambulatory Visit (INDEPENDENT_AMBULATORY_CARE_PROVIDER_SITE_OTHER): Payer: BC Managed Care – PPO | Admitting: Obstetrics & Gynecology

## 2014-01-18 VITALS — BP 110/80 | Ht 67.0 in | Wt 153.0 lb

## 2014-01-18 DIAGNOSIS — Z1151 Encounter for screening for human papillomavirus (HPV): Secondary | ICD-10-CM | POA: Insufficient documentation

## 2014-01-18 DIAGNOSIS — Z01419 Encounter for gynecological examination (general) (routine) without abnormal findings: Secondary | ICD-10-CM

## 2014-01-18 NOTE — Progress Notes (Signed)
Patient ID: Renee Williams, female   DOB: [birth year], 43 y.o.   MRN: [id number] Subjective:     Renee Williams is a 43 y.o. female here for a routine exam.  Patient's last menstrual period was 01/03/2014. [id number] Birth Control Method:  BTL Menstrual Calendar(currently): regular getting heavier  Current complaints: had RLQ pain now resolved.   Current acute medical issues:  none   Recent Gynecologic History Patient's last menstrual period was 01/03/2014. Last Pap: 2013,  normal Last mammogram: 1996,  cyst  Past Medical History  Diagnosis Date  . HELLP syndrome 2009  . Pregnancy induced hypertension   . Gallstones   . Gestational diabetes     Diet controlled    Past Surgical History  Procedure Laterality Date  . Cesarean section    . Wisdom tooth extraction    . Cesarean section with bilateral tubal ligation N/A 09/21/2012    Procedure: CESAREAN SECTION WITH BILATERAL TUBAL LIGATION;  Surgeon: Woodroe Mode, MD;  Location: [facility];  Service: Obstetrics;  Laterality: N/A;    OB History   Grav Para Term Preterm Abortions TAB SAB Ect Mult Living   3 2 2  1  1   2       History   Social History  . Marital Status: Married    Spouse Name: N/A    Number of Children: N/A  . Years of Education: N/A   Social History Main Topics  . Smoking status: Never Smoker   . Smokeless tobacco: Never Used  . Alcohol Use: No  . Drug Use: No  . Sexual Activity: Yes    Birth Control/ Protection: Other-see comments   Other Topics Concern  . None   Social History Narrative  . None    Family History  Problem Relation Age of Onset  . Diabetes Maternal Aunt   . Stroke Maternal Aunt   . Diabetes Maternal Uncle   . Diabetes Maternal Grandmother   . Hypertension Maternal Grandmother   . Cancer Maternal Grandfather     Lung  . Diabetes Paternal Grandmother      Review of Systems  Review of Systems  Constitutional: Negative for fever, chills, weight loss, malaise/fatigue and diaphoresis.   HENT: Negative for hearing loss, ear pain, nosebleeds, congestion, sore throat, neck pain, tinnitus and ear discharge.   Eyes: Negative for blurred vision, double vision, photophobia, pain, discharge and redness.  Respiratory: Negative for cough, hemoptysis, sputum production, shortness of breath, wheezing and stridor.   Cardiovascular: Negative for chest pain, palpitations, orthopnea, claudication, leg swelling and PND.  Gastrointestinal: negative for abdominal pain. Negative for heartburn, nausea, vomiting, diarrhea, constipation, blood in stool and melena.  Genitourinary: Negative for dysuria, urgency, frequency, hematuria and flank pain.  Musculoskeletal: Negative for myalgias, back pain, joint pain and falls.  Skin: Negative for itching and rash.  Neurological: Negative for dizziness, tingling, tremors, sensory change, speech change, focal weakness, seizures, loss of consciousness, weakness and headaches.  Endo/Heme/Allergies: Negative for environmental allergies and polydipsia. Does not bruise/bleed easily.  Psychiatric/Behavioral: Negative for depression, suicidal ideas, hallucinations, memory loss and substance abuse. The patient is not nervous/anxious and does not have insomnia.        Objective:    Physical Exam  Vitals reviewed. Constitutional: She is oriented to person, place, and time. She appears well-developed and well-nourished.  HENT:  Head: Normocephalic and atraumatic.        Right Ear: External ear normal.  Left Ear: External ear normal.  Nose: Nose normal.  Mouth/Throat: Oropharynx is clear and moist.  Eyes: Conjunctivae and EOM are normal. Pupils are equal, round, and reactive to light. Right eye exhibits no discharge. Left eye exhibits no discharge. No scleral icterus.  Neck: Normal range of motion. Neck supple. No tracheal deviation present. No thyromegaly present.  Cardiovascular: Normal rate, regular rhythm, normal heart sounds and intact distal pulses.  Exam  reveals no gallop and no friction rub.   No murmur heard. Respiratory: Effort normal and breath sounds normal. No respiratory distress. She has no wheezes. She has no rales. She exhibits no tenderness.  GI: Soft. Bowel sounds are normal. She exhibits no distension and no mass. There is no tenderness. There is no rebound and no guarding.  Genitourinary:  Breasts no masses skin changes or nipple changes bilaterally      Vulva is normal without lesions Vagina is pink moist without discharge Cervix normal in appearance and pap is done Uterus is normal size shape and contour Adnexa is negative with normal sized ovaries   Musculoskeletal: Normal range of motion. She exhibits no edema and no tenderness.  Neurological: She is alert and oriented to person, place, and time. She has normal reflexes. She displays normal reflexes. No cranial nerve deficit. She exhibits normal muscle tone. Coordination normal.  Skin: Skin is warm and dry. No rash noted. No erythema. No pallor.  Psychiatric: She has a normal mood and affect. Her behavior is normal. Judgment and thought content normal.       Assessment:    Healthy female exam.    Plan:    Mammogram ordered. Follow up in: 1 year.

## 2014-01-19 LAB — CYTOLOGY - PAP

## 2014-03-05 ENCOUNTER — Encounter: Payer: Self-pay | Admitting: Obstetrics & Gynecology

## 2014-12-06 ENCOUNTER — Other Ambulatory Visit (HOSPITAL_COMMUNITY): Payer: Self-pay | Admitting: Nurse Practitioner

## 2014-12-11 ENCOUNTER — Encounter (HOSPITAL_COMMUNITY): Payer: Self-pay

## 2015-01-21 ENCOUNTER — Other Ambulatory Visit: Payer: Self-pay | Admitting: Obstetrics & Gynecology

## 2015-01-22 ENCOUNTER — Other Ambulatory Visit (HOSPITAL_COMMUNITY)
Admission: RE | Admit: 2015-01-22 | Discharge: 2015-01-22 | Disposition: A | Discharge status: Home / Self Care | Payer: 59 | Source: Ambulatory Visit | Attending: Obstetrics & Gynecology | Admitting: Obstetrics & Gynecology

## 2015-01-22 ENCOUNTER — Encounter: Payer: Self-pay | Admitting: Obstetrics & Gynecology

## 2015-01-22 ENCOUNTER — Ambulatory Visit (INDEPENDENT_AMBULATORY_CARE_PROVIDER_SITE_OTHER): Payer: 59 | Admitting: Obstetrics & Gynecology

## 2015-01-22 VITALS — BP 100/80 | HR 84 | Ht 67.0 in | Wt 177.0 lb

## 2015-01-22 DIAGNOSIS — Z01419 Encounter for gynecological examination (general) (routine) without abnormal findings: Secondary | ICD-10-CM | POA: Insufficient documentation

## 2015-01-22 NOTE — Progress Notes (Signed)
Patient ID: Renee Williams, female   DOB: 1970-08-11, 44 y.o.   MRN: [id number] Subjective:     Renee Williams is a 44 y.o. female here for a routine exam.  Patient's last menstrual period was 01/13/2015. [id number] Birth Control Method:  BTL with C section Menstrual Calendar(currently): regular  Current complaints: 4 days of heavy bleeding uses pads and tampons together soils clothes sheets feels drained Cramping not bad not the issue.   Current acute medical issues:  none   Recent Gynecologic History Patient's last menstrual period was 01/13/2015. Last Pap: 2015,  normal Last mammogram: pending,    Past Medical History  Diagnosis Date  . HELLP syndrome 2009  . Pregnancy induced hypertension   . Gallstones   . Gestational diabetes     Diet controlled    Past Surgical History  Procedure Laterality Date  . Cesarean section    . Wisdom tooth extraction    . Cesarean section with bilateral tubal ligation N/A 09/21/2012    Procedure: CESAREAN SECTION WITH BILATERAL TUBAL LIGATION;  Surgeon: Woodroe Mode, MD;  Location: [facility];  Service: Obstetrics;  Laterality: N/A;    OB History    Gravida Para Term Preterm AB TAB SAB Ectopic Multiple Living   3 2 2  1  1   2       Social History   Social History  . Marital Status: Married    Spouse Name: N/A  . Number of Children: N/A  . Years of Education: N/A   Social History Main Topics  . Smoking status: Never Smoker   . Smokeless tobacco: Never Used  . Alcohol Use: No  . Drug Use: No  . Sexual Activity: Yes    Birth Control/ Protection: Other-see comments   Other Topics Concern  . None   Social History Narrative    Family History  Problem Relation Age of Onset  . Diabetes Maternal Aunt   . Stroke Maternal Aunt   . Diabetes Maternal Uncle   . Diabetes Maternal Grandmother   . Hypertension Maternal Grandmother   . Cancer Maternal Grandfather     Lung  . Diabetes Paternal Grandmother      Current outpatient prescriptions:   .  buPROPion (WELLBUTRIN) 100 MG tablet, Take 100 mg by mouth 2 (two) times daily., Disp: , Rfl:  .  Krill Oil Omega-3 300 MG CAPS, Take by mouth., Disp: , Rfl:  .  naproxen sodium (ALEVE) 220 MG tablet, Take 220 mg by mouth 2 (two) times daily with a meal., Disp: , Rfl:  .  ondansetron (ZOFRAN) 4 MG tablet, Take 1 tablet (4 mg total) by mouth every 8 (eight) hours as needed for nausea or vomiting. (Patient not taking: Reported on 01/22/2015), Disp: 6 tablet, Rfl: 0 .  oxyCODONE-acetaminophen (PERCOCET/ROXICET) 5-325 MG per tablet, 1 or 2 tabs PO q6h prn pain (Patient not taking: Reported on 01/22/2015), Disp: 25 tablet, Rfl: 0 .  Prenatal Vit-Fe Fumarate-FA (PRENATAL MULTIVITAMIN) TABS tablet, Take 1 tablet by mouth daily at 12 noon., Disp: , Rfl:   Review of Systems  Review of Systems  Constitutional: Negative for fever, chills, weight loss, malaise/fatigue and diaphoresis.  HENT: Negative for hearing loss, ear pain, nosebleeds, congestion, sore throat, neck pain, tinnitus and ear discharge.   Eyes: Negative for blurred vision, double vision, photophobia, pain, discharge and redness.  Respiratory: Negative for cough, hemoptysis, sputum production, shortness of breath, wheezing and stridor.   Cardiovascular: Negative for chest pain, palpitations, orthopnea, claudication, leg  swelling and PND.  Gastrointestinal: negative for abdominal pain. Negative for heartburn, nausea, vomiting, diarrhea, constipation, blood in stool and melena.  Genitourinary: Negative for dysuria, urgency, frequency, hematuria and flank pain.  Musculoskeletal: Negative for myalgias, back pain, joint pain and falls.  Skin: Negative for itching and rash.  Neurological: Negative for dizziness, tingling, tremors, sensory change, speech change, focal weakness, seizures, loss of consciousness, weakness and headaches.  Endo/Heme/Allergies: Negative for environmental allergies and polydipsia. Does not bruise/bleed easily.   Psychiatric/Behavioral: Negative for depression, suicidal ideas, hallucinations, memory loss and substance abuse. The patient is not nervous/anxious and does not have insomnia.        Objective:  Blood pressure 100/80, pulse 84, height 5\' 7"  (1.702 m), weight 177 lb (80.287 kg), last menstrual period 01/13/2015, currently breastfeeding.   Physical Exam  Vitals reviewed. Constitutional: She is oriented to person, place, and time. She appears well-developed and well-nourished.  HENT:  Head: Normocephalic and atraumatic.        Right Ear: External ear normal.  Left Ear: External ear normal.  Nose: Nose normal.  Mouth/Throat: Oropharynx is clear and moist.  Eyes: Conjunctivae and EOM are normal. Pupils are equal, round, and reactive to light. Right eye exhibits no discharge. Left eye exhibits no discharge. No scleral icterus.  Neck: Normal range of motion. Neck supple. No tracheal deviation present. No thyromegaly present.  Cardiovascular: Normal rate, regular rhythm, normal heart sounds and intact distal pulses.  Exam reveals no gallop and no friction rub.   No murmur heard. Respiratory: Effort normal and breath sounds normal. No respiratory distress. She has no wheezes. She has no rales. She exhibits no tenderness.  GI: Soft. Bowel sounds are normal. She exhibits no distension and no mass. There is no tenderness. There is no rebound and no guarding.  Genitourinary:  Breasts no masses skin changes or nipple changes bilaterally      Vulva is normal without lesions Vagina is pink moist without discharge Cervix normal in appearance and pap is done Uterus is normal size shape and contour Adnexa is negative with normal sized ovaries   Musculoskeletal: Normal range of motion. She exhibits no edema and no tenderness.  Neurological: She is alert and oriented to person, place, and time. She has normal reflexes. She displays normal reflexes. No cranial nerve deficit. She exhibits normal muscle  tone. Coordination normal.  Skin: Skin is warm and dry. No rash noted. No erythema. No pallor.  Psychiatric: She has a normal mood and affect. Her behavior is normal. Judgment and thought content normal.       Assessment:    Healthy female exam.    Plan:    Mammogram ordered.   Considering ablation

## 2015-01-23 LAB — CYTOLOGY - PAP

## 2015-04-02 ENCOUNTER — Telehealth: Payer: 59 | Admitting: Family

## 2015-04-02 DIAGNOSIS — J069 Acute upper respiratory infection, unspecified: Secondary | ICD-10-CM | POA: Diagnosis not present

## 2015-04-02 DIAGNOSIS — R6889 Other general symptoms and signs: Secondary | ICD-10-CM

## 2015-04-02 NOTE — Progress Notes (Signed)
E visit for Flu like symptoms   We are sorry that you are not feeling well.  Here is how we plan to help! Based on what you have shared with me it looks like you may have flu-like symptoms that should be watched but do not seem to indicate anti-viral treatment.  Influenza or "the flu" is   an infection caused by a respiratory virus. The flu virus is highly contagious and persons who did not receive their yearly flu vaccination may "catch" the flu from close contact.  We have anti-viral medications to treat the viruses that cause this infection. They are not a "cure" and only shorten the course of the infection. These prescriptions are most effective when they are given within the first 2 days of "flu" symptoms. Antiviral medication are indicated if you have a high risk of complications from the flu. You should  also consider an antiviral medication if you are in close contact with someone who is at risk. These medications can help patients avoid complications from the flu  but have side effects that you should know. Possible side effects from Tamiflu or oseltamivir include nausea, vomiting, diarrhea, dizziness, headaches, eye redness, sleep problems or other respiratory symptoms. You should not take Tamiflu if you have an allergy to oseltamivir or any to the ingredients in Tamiflu.  Based upon your symptoms and potential risk factors I recommend that you follow the flu symptoms recommendation that I have listed below.  *To answer your question, this does not meet the criteria for the flu, even less so if you are vaccinated. However, it does meet the criteria for a viral upper respiratory infection, which are common. If your fever goes over 101.2 and/or you get nausea/vomiting or severe shortness of breath, that would shift the diagnosis more toward the flu. At this point, the chances of that are extremely low. This would be supportive care at this point in time.   ANYONE WHO HAS FLU SYMPTOMS  SHOULD: . Stay home. The flu is highly contagious and going out or to work exposes others! . Be sure to drink plenty of fluids. Water is fine as well as fruit juices, sodas and electrolyte beverages. You may want to stay away from caffeine or alcohol. If you are nauseated, try taking small sips of liquids. How do you know if you are getting enough fluid? Your urine should be a pale yellow or almost colorless. . Get rest. . Taking a steamy shower or using a humidifier may help nasal congestion and ease sore throat pain. Using a saline nasal spray works much the same way. . Cough drops, hard candies and sore throat lozenges may ease your cough. . Line up a caregiver. Have someone check on you regularly.   GET HELP RIGHT AWAY IF: . You cannot keep down liquids or your medications. . You become short of breath . Your fell like you are going to pass out or loose consciousness. . Your symptoms persist after you have completed your treatment plan MAKE SURE YOU   Understand these instructions.  Will watch your condition.  Will get help right away if you are not doing well or get worse.  Your e-visit answers were reviewed by a board certified advanced clinical practitioner to complete your personal care plan.  Depending on the condition, your plan could have included both over the counter or prescription medications.  If there is a problem please reply  once you have received a response from your provider.  Your safety is important to [place].  If you have drug allergies check your prescription carefully.    You can use MyChart to ask questions about today's visit, request a non-urgent call back, or ask for a work or school excuse for 24 hours related to this e-Visit. If it has been greater than 24 hours you will need to follow up with your provider, or enter a new e-Visit to address those concerns.  You will get an e-mail in the next two days asking about your experience.  I hope that your e-visit  has been valuable and will speed your recovery. Thank you for using e-visits.

## 2015-04-03 NOTE — Progress Notes (Signed)
From my coworker-

## 2015-04-13 ENCOUNTER — Ambulatory Visit (INDEPENDENT_AMBULATORY_CARE_PROVIDER_SITE_OTHER): Payer: 59 | Admitting: Internal Medicine

## 2015-04-13 VITALS — BP 104/60 | HR 92 | Temp 98.8°F | Resp 14 | Ht 67.0 in | Wt 179.2 lb

## 2015-04-13 DIAGNOSIS — Z6828 Body mass index (BMI) 28.0-28.9, adult: Secondary | ICD-10-CM | POA: Diagnosis not present

## 2015-04-13 DIAGNOSIS — J4520 Mild intermittent asthma, uncomplicated: Secondary | ICD-10-CM | POA: Diagnosis not present

## 2015-04-13 DIAGNOSIS — J988 Other specified respiratory disorders: Secondary | ICD-10-CM | POA: Diagnosis not present

## 2015-04-13 DIAGNOSIS — G4489 Other headache syndrome: Secondary | ICD-10-CM

## 2015-04-13 DIAGNOSIS — J22 Unspecified acute lower respiratory infection: Secondary | ICD-10-CM

## 2015-04-13 DIAGNOSIS — S161XXD Strain of muscle, fascia and tendon at neck level, subsequent encounter: Secondary | ICD-10-CM

## 2015-04-13 MED ORDER — CYCLOBENZAPRINE HCL 10 MG PO TABS: 10.0000 mg | ORAL_TABLET | Freq: Every day | ORAL | Status: DC

## 2015-04-13 MED ORDER — ALBUTEROL SULFATE HFA 108 (90 BASE) MCG/ACT IN AERS: 2.0000 | INHALATION_SPRAY | Freq: Four times a day (QID) | RESPIRATORY_TRACT | Status: DC | PRN

## 2015-04-13 MED ORDER — BUTALBITAL-APAP-CAFFEINE 50-325-40 MG PO TABS: 1.0000 | ORAL_TABLET | Freq: Four times a day (QID) | ORAL | Status: DC | PRN

## 2015-04-13 MED ORDER — AZITHROMYCIN 250 MG PO TABS: ORAL_TABLET | ORAL | Status: DC
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
## 2015-04-15 ENCOUNTER — Telehealth: Payer: Self-pay

## 2015-04-15 DIAGNOSIS — G44311 Acute post-traumatic headache, intractable: Secondary | ICD-10-CM
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
## 2015-04-15 NOTE — Telephone Encounter (Signed)
oow 48-72hr rom neck Fu 1 week By signing my name below, I, Moises Blood, attest that this documentation has been prepared under the direction and in the presence of [name], MD. Electronically Signed: Moises Blood, [place]. 04/13/2015 , 2:04 PM .

## 2015-04-16 NOTE — Telephone Encounter (Signed)
Called pt and advised message from provider on their voicemail.  

## 2015-05-13 ENCOUNTER — Emergency Department (HOSPITAL_COMMUNITY)
Admission: EM | Admit: 2015-05-13 | Discharge: 2015-05-13 | Disposition: A | Discharge status: Home / Self Care | Payer: 59 | Attending: Emergency Medicine | Admitting: Emergency Medicine

## 2015-05-13 ENCOUNTER — Encounter (HOSPITAL_COMMUNITY): Payer: Self-pay | Admitting: Emergency Medicine

## 2015-05-13 DIAGNOSIS — Z79899 Other long term (current) drug therapy: Secondary | ICD-10-CM | POA: Insufficient documentation

## 2015-05-13 DIAGNOSIS — R51 Headache: Secondary | ICD-10-CM | POA: Insufficient documentation

## 2015-05-13 DIAGNOSIS — M542 Cervicalgia: Secondary | ICD-10-CM | POA: Diagnosis not present

## 2015-05-13 DIAGNOSIS — Z8632 Personal history of gestational diabetes: Secondary | ICD-10-CM | POA: Diagnosis not present

## 2015-05-13 DIAGNOSIS — Z791 Long term (current) use of non-steroidal anti-inflammatories (NSAID): Secondary | ICD-10-CM | POA: Insufficient documentation

## 2015-05-13 DIAGNOSIS — M5432 Sciatica, left side: Secondary | ICD-10-CM | POA: Insufficient documentation

## 2015-05-13 DIAGNOSIS — M549 Dorsalgia, unspecified: Secondary | ICD-10-CM | POA: Diagnosis present

## 2015-05-13 MED ORDER — PREDNISONE 20 MG PO TABS: ORAL_TABLET | ORAL | Status: DC

## 2015-05-13 MED ORDER — NAPROXEN 375 MG PO TABS: 375.0000 mg | ORAL_TABLET | Freq: Two times a day (BID) | ORAL | Status: DC | PRN

## 2015-05-13 MED ORDER — METHOCARBAMOL 500 MG PO TABS: 500.0000 mg | ORAL_TABLET | Freq: Three times a day (TID) | ORAL | Status: DC | PRN

## 2015-05-13 MED ORDER — NAPROXEN 250 MG PO TABS
500.0000 mg | ORAL_TABLET | Freq: Once | ORAL | Status: AC
  Administered 2015-05-13: 500 mg via ORAL
  Filled 2015-05-13: qty 2

## 2015-05-13 NOTE — Discharge Instructions (Signed)
If you were given medicines take as directed.  If you are on coumadin or contraceptives realize their levels and effectiveness is altered by many different medicines.  If you have any reaction (rash, tongues swelling, other) to the medicines stop taking and see a physician.   Naproxen is an NSAID, do not take with other NSAIDS such as aleve, ibuprofen, motrin, et Ronney Asters.  If your blood pressure was elevated in the ER make sure you follow up for management with a primary doctor or return for chest pain, shortness of breath or stroke symptoms.  Please follow up as directed and return to the ER or see a physician for new or worsening symptoms such as weakness, bowel or bladder changes, fevers.  Thank you. Filed Vitals:   05/13/15 0746  BP: 129/76  Pulse: 83  Temp: 97.9 F (36.6 C)  TempSrc: Oral  Resp: 18  Height: 5\' 7"  (1.702 m)  Weight: 180 lb (81.647 kg)  SpO2: 99%
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
## 2015-05-14 DIAGNOSIS — S134XXA Sprain of ligaments of cervical spine, initial encounter: Secondary | ICD-10-CM | POA: Diagnosis not present

## 2015-05-14 DIAGNOSIS — M542 Cervicalgia: Secondary | ICD-10-CM | POA: Diagnosis not present

## 2015-05-14 DIAGNOSIS — S39012A Strain of muscle, fascia and tendon of lower back, initial encounter: Secondary | ICD-10-CM | POA: Diagnosis not present

## 2015-05-14 DIAGNOSIS — M5442 Lumbago with sciatica, left side: Secondary | ICD-10-CM | POA: Diagnosis not present

## 2015-05-14 DIAGNOSIS — S3992XA Unspecified injury of lower back, initial encounter: Secondary | ICD-10-CM | POA: Diagnosis not present

## 2015-05-14 DIAGNOSIS — M629 Disorder of muscle, unspecified: Secondary | ICD-10-CM | POA: Diagnosis not present

## 2015-05-29 MED FILL — CYCLOBENZAPRINE 10 MG TAB: 10 | 10 days supply | Qty: 10 | Fill #0

## 2015-05-29 MED FILL — BUPROPION HCL XL 150 MG TAB: 150 | 90 days supply | Qty: 90 | Fill #0 | Status: TO

## 2015-06-03 ENCOUNTER — Ambulatory Visit (INDEPENDENT_AMBULATORY_CARE_PROVIDER_SITE_OTHER): Payer: 59 | Admitting: Neurology

## 2015-06-03 ENCOUNTER — Encounter: Payer: Self-pay | Admitting: Neurology

## 2015-06-03 VITALS — BP 126/74 | HR 100 | Ht 67.0 in | Wt 174.0 lb

## 2015-06-03 DIAGNOSIS — G4441 Drug-induced headache, not elsewhere classified, intractable: Secondary | ICD-10-CM | POA: Diagnosis not present

## 2015-06-03 DIAGNOSIS — M5417 Radiculopathy, lumbosacral region: Secondary | ICD-10-CM

## 2015-06-03 DIAGNOSIS — G4440 Drug-induced headache, not elsewhere classified, not intractable: Secondary | ICD-10-CM

## 2015-06-03 DIAGNOSIS — G43009 Migraine without aura, not intractable, without status migrainosus: Secondary | ICD-10-CM | POA: Diagnosis not present

## 2015-06-03 MED ORDER — TOPIRAMATE 25 MG PO TABS: 25.0000 mg | ORAL_TABLET | Freq: Every day | ORAL | Status: DC
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
## 2015-06-03 NOTE — Patient Instructions (Signed)
Migraine Recommendations: 1.  Start topiramate 25mg  at bedtime.  Call in 4 weeks with update and we can adjust dose if needed. 2.  May take NSAIDs, but not Fioricet 3.  Limit use of pain relievers to no more than 2 days out of the week.  These medications include acetaminophen, ibuprofen, triptans and narcotics.  This will help reduce risk of rebound headaches. 4.  Be aware of common food triggers such as processed sweets, processed foods with nitrites (such as deli meat, hot dogs, sausages), foods with MSG, alcohol (such as wine), chocolate, certain cheeses, certain fruits (dried fruits, some citrus fruit), vinegar, diet soda. 4.  Avoid caffeine 5.  Routine exercise 6.  Proper sleep hygiene 7.  Stay adequately hydrated with water 8.  Keep a headache diary. 9.  Maintain proper stress management. 10.  Do not skip meals. 11.  Consider supplements:  Magnesium oxide 400mg  to 600mg  daily, riboflavin 400mg , Coenzyme Q 10 100mg  three times daily 12.  Follow up in 4 months or so

## 2015-06-03 NOTE — Progress Notes (Signed)
Chart forwarded.  

## 2015-06-17 DIAGNOSIS — B351 Tinea unguium: Secondary | ICD-10-CM | POA: Diagnosis not present

## 2015-06-25 DIAGNOSIS — M542 Cervicalgia: Secondary | ICD-10-CM | POA: Diagnosis not present

## 2015-06-25 DIAGNOSIS — M629 Disorder of muscle, unspecified: Secondary | ICD-10-CM | POA: Diagnosis not present

## 2015-06-25 DIAGNOSIS — S134XXD Sprain of ligaments of cervical spine, subsequent encounter: Secondary | ICD-10-CM | POA: Diagnosis not present

## 2015-06-25 DIAGNOSIS — M545 Low back pain: Secondary | ICD-10-CM | POA: Diagnosis not present

## 2015-06-29 ENCOUNTER — Other Ambulatory Visit: Payer: Self-pay | Admitting: Internal Medicine

## 2015-07-02 DIAGNOSIS — M545 Low back pain: Secondary | ICD-10-CM | POA: Diagnosis not present

## 2015-07-02 DIAGNOSIS — S134XXD Sprain of ligaments of cervical spine, subsequent encounter: Secondary | ICD-10-CM | POA: Diagnosis not present

## 2015-07-02 DIAGNOSIS — M629 Disorder of muscle, unspecified: Secondary | ICD-10-CM | POA: Diagnosis not present

## 2015-07-02 DIAGNOSIS — M542 Cervicalgia: Secondary | ICD-10-CM | POA: Diagnosis not present

## 2015-07-09 DIAGNOSIS — M629 Disorder of muscle, unspecified: Secondary | ICD-10-CM | POA: Diagnosis not present

## 2015-07-09 DIAGNOSIS — M545 Low back pain: Secondary | ICD-10-CM | POA: Diagnosis not present

## 2015-07-09 DIAGNOSIS — S134XXD Sprain of ligaments of cervical spine, subsequent encounter: Secondary | ICD-10-CM | POA: Diagnosis not present

## 2015-07-09 DIAGNOSIS — M542 Cervicalgia: Secondary | ICD-10-CM | POA: Diagnosis not present

## 2015-07-11 DIAGNOSIS — F329 Major depressive disorder, single episode, unspecified: Secondary | ICD-10-CM | POA: Diagnosis not present

## 2015-07-11 DIAGNOSIS — Z0001 Encounter for general adult medical examination with abnormal findings: Secondary | ICD-10-CM | POA: Diagnosis not present

## 2015-07-11 DIAGNOSIS — R5383 Other fatigue: Secondary | ICD-10-CM | POA: Diagnosis not present

## 2015-07-11 DIAGNOSIS — D508 Other iron deficiency anemias: Secondary | ICD-10-CM | POA: Diagnosis not present

## 2015-07-11 DIAGNOSIS — F419 Anxiety disorder, unspecified: Secondary | ICD-10-CM | POA: Diagnosis not present

## 2015-07-11 DIAGNOSIS — Z6827 Body mass index (BMI) 27.0-27.9, adult: Secondary | ICD-10-CM | POA: Diagnosis not present

## 2015-07-11 DIAGNOSIS — M549 Dorsalgia, unspecified: Secondary | ICD-10-CM | POA: Diagnosis not present

## 2015-07-12 DIAGNOSIS — S134XXD Sprain of ligaments of cervical spine, subsequent encounter: Secondary | ICD-10-CM | POA: Diagnosis not present

## 2015-07-12 DIAGNOSIS — M542 Cervicalgia: Secondary | ICD-10-CM | POA: Diagnosis not present

## 2015-07-12 DIAGNOSIS — M545 Low back pain: Secondary | ICD-10-CM | POA: Diagnosis not present

## 2015-07-12 DIAGNOSIS — M629 Disorder of muscle, unspecified: Secondary | ICD-10-CM | POA: Diagnosis not present

## 2015-07-15 DIAGNOSIS — R739 Hyperglycemia, unspecified: Secondary | ICD-10-CM | POA: Diagnosis not present

## 2015-07-16 DIAGNOSIS — M542 Cervicalgia: Secondary | ICD-10-CM | POA: Diagnosis not present

## 2015-07-16 DIAGNOSIS — S134XXD Sprain of ligaments of cervical spine, subsequent encounter: Secondary | ICD-10-CM | POA: Diagnosis not present

## 2015-07-16 DIAGNOSIS — M629 Disorder of muscle, unspecified: Secondary | ICD-10-CM | POA: Diagnosis not present

## 2015-07-16 DIAGNOSIS — M545 Low back pain: Secondary | ICD-10-CM | POA: Diagnosis not present

## 2015-07-19 DIAGNOSIS — M542 Cervicalgia: Secondary | ICD-10-CM | POA: Diagnosis not present

## 2015-07-19 DIAGNOSIS — M629 Disorder of muscle, unspecified: Secondary | ICD-10-CM | POA: Diagnosis not present

## 2015-07-19 DIAGNOSIS — M545 Low back pain: Secondary | ICD-10-CM | POA: Diagnosis not present

## 2015-07-23 DIAGNOSIS — M545 Low back pain: Secondary | ICD-10-CM | POA: Diagnosis not present

## 2015-07-23 DIAGNOSIS — M629 Disorder of muscle, unspecified: Secondary | ICD-10-CM | POA: Diagnosis not present

## 2015-07-23 DIAGNOSIS — M542 Cervicalgia: Secondary | ICD-10-CM | POA: Diagnosis not present

## 2015-07-26 DIAGNOSIS — M542 Cervicalgia: Secondary | ICD-10-CM | POA: Diagnosis not present

## 2015-07-26 DIAGNOSIS — M629 Disorder of muscle, unspecified: Secondary | ICD-10-CM | POA: Diagnosis not present

## 2015-07-26 DIAGNOSIS — M545 Low back pain: Secondary | ICD-10-CM | POA: Diagnosis not present

## 2015-07-30 DIAGNOSIS — M5442 Lumbago with sciatica, left side: Secondary | ICD-10-CM | POA: Diagnosis not present

## 2015-07-30 DIAGNOSIS — M629 Disorder of muscle, unspecified: Secondary | ICD-10-CM | POA: Diagnosis not present

## 2015-07-30 DIAGNOSIS — M545 Low back pain: Secondary | ICD-10-CM | POA: Diagnosis not present

## 2015-07-30 DIAGNOSIS — S39012D Strain of muscle, fascia and tendon of lower back, subsequent encounter: Secondary | ICD-10-CM | POA: Diagnosis not present

## 2015-07-30 DIAGNOSIS — M542 Cervicalgia: Secondary | ICD-10-CM | POA: Diagnosis not present

## 2015-07-30 DIAGNOSIS — S134XXD Sprain of ligaments of cervical spine, subsequent encounter: Secondary | ICD-10-CM | POA: Diagnosis not present

## 2015-08-01 ENCOUNTER — Other Ambulatory Visit (HOSPITAL_COMMUNITY): Payer: Self-pay | Admitting: Sports Medicine

## 2015-08-01 DIAGNOSIS — M5442 Lumbago with sciatica, left side: Secondary | ICD-10-CM

## 2015-08-08 ENCOUNTER — Ambulatory Visit (HOSPITAL_COMMUNITY)
Admission: RE | Admit: 2015-08-08 | Discharge: 2015-08-08 | Disposition: A | Discharge status: Home / Self Care | Payer: 59 | Source: Ambulatory Visit | Attending: Sports Medicine | Admitting: Sports Medicine

## 2015-08-08 DIAGNOSIS — M5442 Lumbago with sciatica, left side: Secondary | ICD-10-CM | POA: Diagnosis not present

## 2015-08-08 DIAGNOSIS — K8020 Calculus of gallbladder without cholecystitis without obstruction: Secondary | ICD-10-CM | POA: Insufficient documentation

## 2015-08-08 DIAGNOSIS — M25561 Pain in right knee: Secondary | ICD-10-CM | POA: Diagnosis not present

## 2015-08-14 DIAGNOSIS — S39012D Strain of muscle, fascia and tendon of lower back, subsequent encounter: Secondary | ICD-10-CM | POA: Diagnosis not present

## 2015-08-14 DIAGNOSIS — M5442 Lumbago with sciatica, left side: Secondary | ICD-10-CM | POA: Diagnosis not present

## 2015-08-20 ENCOUNTER — Telehealth: Payer: Self-pay | Admitting: Neurology

## 2015-08-20 MED ORDER — TOPAMAX 50 MG PO TABS: 50.0000 mg | ORAL_TABLET | Freq: Every day | ORAL | Status: DC

## 2015-08-20 NOTE — Telephone Encounter (Signed)
The topamax is working and wants to know if we need to increase the dosage please call [phone]

## 2015-08-20 NOTE — Telephone Encounter (Signed)
We can increase dose to 50mg  at bedtime.  She should contact us in 4 weeks with update.  I would like to know the number of headache days she will have over the next month.

## 2015-08-20 NOTE — Telephone Encounter (Signed)
Left message and Mychart message sent. RX sent in.

## 2015-08-20 NOTE — Telephone Encounter (Signed)
Pt called to let us know that her Topamax 25 mg is working, she is not experiencing any side effects. Would like to know if it can be increased? Pt also stated she would like you to know she is only taking Topamax, Motrin PRN, Wellbutrin, Flexeril PRN (limited since PT was stopped), and Claritin. Please advise.

## 2015-09-09 ENCOUNTER — Ambulatory Visit (INDEPENDENT_AMBULATORY_CARE_PROVIDER_SITE_OTHER): Payer: 59 | Admitting: Obstetrics & Gynecology

## 2015-09-09 ENCOUNTER — Encounter: Payer: Self-pay | Admitting: Obstetrics & Gynecology

## 2015-09-09 VITALS — Ht 67.0 in | Wt 167.0 lb

## 2015-09-09 DIAGNOSIS — N938 Other specified abnormal uterine and vaginal bleeding: Secondary | ICD-10-CM

## 2015-09-09 MED ORDER — MEGESTROL ACETATE 40 MG PO TABS: ORAL_TABLET | ORAL | Status: DC

## 2015-09-09 NOTE — Progress Notes (Signed)
Patient ID: Renee Williams, female   DOB: 1970/06/25, 45 y.o.   MRN: [id number] History of Present Illness   Patient Identification Renee Williams is a 45 y.o. female.  Patient information was obtained from patient. History/Exam limitations: none.   Chief Complaint  discuss hormones and periods   The patient complains vaginal bleeding described as bright red, similar to period, heavier than period and passing medium clots, using tampons/sheets/change clothes pad(s) since onset. Onset of symptoms was 2   months ago. Severity of symptoms at onset was moderate. Symptoms occur spontaneous  Past Medical History  Diagnosis Date  . HELLP syndrome 2009  . Pregnancy induced hypertension   . Gallstones   . Gestational diabetes     Diet controlled   Family History  Problem Relation Age of Onset  . Diabetes Maternal Aunt   . Stroke Maternal Aunt   . Diabetes Maternal Uncle   . Diabetes Maternal Grandmother   . Hypertension Maternal Grandmother   . Cancer Maternal Grandfather     Lung  . Diabetes Paternal Grandmother    Scheduled Meds: Continuous Infusions: PRN Meds:  No Known Allergies Social History   Social History  . Marital Status: Married    Spouse Name: N/A  . Number of Children: N/A  . Years of Education: N/A   Occupational History  . Not on file.   Social History Main Topics  . Smoking status: Never Smoker   . Smokeless tobacco: Never Used  . Alcohol Use: No  . Drug Use: No  . Sexual Activity: Yes    Birth Control/ Protection: Other-see comments   Other Topics Concern  . Not on file   Social History Narrative   Review of Systems Pertinent items are noted in HPI.   Physical Exam   Ht 5\' 7"  (1.702 m)  Wt 167 lb (75.751 kg)  BMI 26.15 kg/m2  LMP 09/06/2015                                     ED Course   Studies:   Records Reviewed: Old medical records.  DUB (dysfunctional uterine bleeding) - Plan: US Pelvis Complete, US Transvaginal  Non-OB   Meds ordered this encounter  Medications  . terbinafine (LAMISIL) 250 MG tablet    Sig: Take 250 mg by mouth daily.  . megestrol (MEGACE) 40 MG tablet    Sig: 3 tablets a day for 5 days, 2 tablets a day for 5 days then 1 tablet daily    Dispense:  45 tablet    Refill:  3    Orders Placed This Encounter  Procedures  . US Pelvis Complete    Standing Status: Future     Number of Occurrences:      Standing Expiration Date: 11/08/2016    Order Specific Question:  Reason for Exam (SYMPTOM  OR DIAGNOSIS REQUIRED)    Answer:  dub    Order Specific Question:  Preferred imaging location?    Answer:  Internal  . US Transvaginal Non-OB    Standing Status: Future     Number of Occurrences:      Standing Expiration Date: 11/08/2016    Order Specific Question:  Reason for Exam (SYMPTOM  OR DIAGNOSIS REQUIRED)    Answer:  DUB    Order Specific Question:  Preferred imaging location?    Answer:  Internal   Return in about 1 month (around 10/10/2015)  for GYN sono, Follow up, with Dr Elonda Husky.

## 2015-10-08 ENCOUNTER — Ambulatory Visit (INDEPENDENT_AMBULATORY_CARE_PROVIDER_SITE_OTHER): Payer: 59 | Admitting: Obstetrics & Gynecology

## 2015-10-08 ENCOUNTER — Ambulatory Visit (INDEPENDENT_AMBULATORY_CARE_PROVIDER_SITE_OTHER): Payer: 59

## 2015-10-08 ENCOUNTER — Encounter: Payer: Self-pay | Admitting: Obstetrics & Gynecology

## 2015-10-08 VITALS — BP 110/80 | HR 84 | Wt 166.0 lb

## 2015-10-08 DIAGNOSIS — N854 Malposition of uterus: Secondary | ICD-10-CM

## 2015-10-08 DIAGNOSIS — N938 Other specified abnormal uterine and vaginal bleeding: Secondary | ICD-10-CM

## 2015-10-08 DIAGNOSIS — N888 Other specified noninflammatory disorders of cervix uteri: Secondary | ICD-10-CM | POA: Diagnosis not present

## 2015-10-08 DIAGNOSIS — N946 Dysmenorrhea, unspecified: Secondary | ICD-10-CM

## 2015-10-08 MED ORDER — MEGESTROL ACETATE 40 MG PO TABS: ORAL_TABLET | ORAL | Status: DC

## 2015-10-08 NOTE — Progress Notes (Signed)
PELVIC US TA/TV:homogenous anteverted uterus,normal ov's bilat(mobile),EEC 3.3 mm,small amount of fluid w/in the endometrium,mult small nabothian cysts,no free fluid,no pain during ultrasound

## 2015-10-08 NOTE — Progress Notes (Signed)
Patient ID: Renee Williams, female   DOB: [birth year], 45 y.o.   MRN: [id number] Preoperative History and Physical  Renee Williams is a 45 y.o. [id number] with Patient's last menstrual period was 10/07/2015 (exact date). admitted for a hysteroscopy uterine curettage endometrial ablation for menometrorrhagia dysmenorrhea.  Sonogram is normal Responding decently to megestrol  PMH:    Past Medical History  Diagnosis Date  . HELLP syndrome 2009  . Pregnancy induced hypertension   . Gallstones   . Gestational diabetes     Diet controlled    PSH:     Past Surgical History  Procedure Laterality Date  . Cesarean section    . Wisdom tooth extraction    . Cesarean section with bilateral tubal ligation N/A 09/21/2012    Procedure: CESAREAN SECTION WITH BILATERAL TUBAL LIGATION;  Surgeon: Woodroe Mode, MD;  Location: [facility];  Service: Obstetrics;  Laterality: N/A;    POb/GynH:      OB History    Gravida Para Term Preterm AB TAB SAB Ectopic Multiple Living   3 2 2  1  1   2       SH:   Social History  Substance Use Topics  . Smoking status: Never Smoker   . Smokeless tobacco: Never Used  . Alcohol Use: No    FH:    Family History  Problem Relation Age of Onset  . Diabetes Maternal Aunt   . Stroke Maternal Aunt   . Diabetes Maternal Uncle   . Diabetes Maternal Grandmother   . Hypertension Maternal Grandmother   . Cancer Maternal Grandfather     Lung  . Diabetes Paternal Grandmother      Allergies: No Known Allergies  Medications:       Current outpatient prescriptions:  .  buPROPion (WELLBUTRIN XL) 150 MG 24 hr tablet, Take 150 mg by mouth daily. , Disp: , Rfl: 0 .  cyclobenzaprine (FLEXERIL) 10 MG tablet, Take 1 tablet (10 mg total) by mouth at bedtime., Disp: 10 tablet, Rfl: 0 .  Krill Oil Omega-3 300 MG CAPS, Take by mouth., Disp: , Rfl:  .  lidocaine (LIDODERM) 5 %, Place 1 patch onto the skin daily. Remove & Discard patch within 12 hours or as directed by MD, Disp: , Rfl:  .   megestrol (MEGACE) 40 MG tablet, 2 tablets daily, Disp: 60 tablet, Rfl: 3 .  TOPAMAX 50 MG tablet, Take 1 tablet (50 mg total) by mouth at bedtime., Disp: 30 tablet, Rfl: 1  Review of Systems:   Review of Systems  Constitutional: Negative for fever, chills, weight loss, malaise/fatigue and diaphoresis.  HENT: Negative for hearing loss, ear pain, nosebleeds, congestion, sore throat, neck pain, tinnitus and ear discharge.   Eyes: Negative for blurred vision, double vision, photophobia, pain, discharge and redness.  Respiratory: Negative for cough, hemoptysis, sputum production, shortness of breath, wheezing and stridor.   Cardiovascular: Negative for chest pain, palpitations, orthopnea, claudication, leg swelling and PND.  Gastrointestinal: Positive for abdominal pain. Negative for heartburn, nausea, vomiting, diarrhea, constipation, blood in stool and melena.  Genitourinary: Negative for dysuria, urgency, frequency, hematuria and flank pain.  Musculoskeletal: Negative for myalgias, back pain, joint pain and falls.  Skin: Negative for itching and rash.  Neurological: Negative for dizziness, tingling, tremors, sensory change, speech change, focal weakness, seizures, loss of consciousness, weakness and headaches.  Endo/Heme/Allergies: Negative for environmental allergies and polydipsia. Does not bruise/bleed easily.  Psychiatric/Behavioral: Negative for depression, suicidal ideas, hallucinations, memory loss and substance abuse.  The patient is not nervous/anxious and does not have insomnia.      PHYSICAL EXAM:  Blood pressure 110/80, pulse 84, weight 166 lb (75.297 kg), last menstrual period 10/07/2015, currently breastfeeding.    Vitals reviewed. Constitutional: She is oriented to person, place, and time. She appears well-developed and well-nourished.  HENT:  Head: Normocephalic and atraumatic.  Right Ear: External ear normal.  Left Ear: External ear normal.  Nose: Nose normal.   Mouth/Throat: Oropharynx is clear and moist.  Eyes: Conjunctivae and EOM are normal. Pupils are equal, round, and reactive to light. Right eye exhibits no discharge. Left eye exhibits no discharge. No scleral icterus.  Neck: Normal range of motion. Neck supple. No tracheal deviation present. No thyromegaly present.  Cardiovascular: Normal rate, regular rhythm, normal heart sounds and intact distal pulses.  Exam reveals no gallop and no friction rub.   No murmur heard. Respiratory: Effort normal and breath sounds normal. No respiratory distress. She has no wheezes. She has no rales. She exhibits no tenderness.  GI: Soft. Bowel sounds are normal. She exhibits no distension and no mass. There is tenderness. There is no rebound and no guarding.  Genitourinary:       Vulva is normal without lesions Vagina is pink moist without discharge Cervix normal in appearance and pap is normal Uterus is normal size, contour, position, consistency, mobility, non-tender Adnexa is negative with normal sized ovaries by sonogram  Musculoskeletal: Normal range of motion. She exhibits no edema and no tenderness.  Neurological: She is alert and oriented to person, place, and time. She has normal reflexes. She displays normal reflexes. No cranial nerve deficit. She exhibits normal muscle tone. Coordination normal.  Skin: Skin is warm and dry. No rash noted. No erythema. No pallor.  Psychiatric: She has a normal mood and affect. Her behavior is normal. Judgment and thought content normal.    Labs: No results found for this or any previous visit (from the past 336 hour(s)).  EKG: No orders found for this or any previous visit.  Imaging Studies: US Transvaginal Non-ob  Nov 02, 2015  GYNECOLOGIC SONOGRAM Renee Williams is a 45 y.o. [id number] LMP 10/07/2015 for a pelvic sonogram for dysfunctional uterine bleeding. Uterus                      9.6 x 5.5 x 4.4 cm, homogenous anteverted uterus Endometrium          3.3 mm,  symmetrical, small amount of fluid w/in the endometrium Right ovary             1.2 x 1.8 x 1.7 cm, wnl Left ovary                2.6 x 1.7 x 1.8 cm, wnl No free fluid,no pain during ultrasound. Technician Comments: PELVIC US TA/TV:homogenous anteverted uterus,normal ov's bilat(mobile),EEC 3.3 mm,small amount of fluid w/in the endometrium,mult small nabothian cysts,no free fluid,no pain during ultrasound [employer] Nov 02, 2015 10:09 AM Clinical Impression and recommendations: I have reviewed the sonogram results above, combined with the patient's current clinical course, below are my impressions and any appropriate recommendations for management based on the sonographic findings. Normal uterus and endometrium, no pathology noted as cause of abnormal bleeding Ovaries are normal Renee Williams 11/02/15 10:16 AM   US Pelvis Complete  11-02-2015  GYNECOLOGIC SONOGRAM Renee Williams is a 45 y.o. [id number] LMP 10/07/2015 for a pelvic sonogram for dysfunctional uterine bleeding. Uterus  9.6 x 5.5 x 4.4 cm, homogenous anteverted uterus Endometrium          3.3 mm, symmetrical, small amount of fluid w/in the endometrium Right ovary             1.2 x 1.8 x 1.7 cm, wnl Left ovary                2.6 x 1.7 x 1.8 cm, wnl No free fluid,no pain during ultrasound. Technician Comments: PELVIC US TA/TV:homogenous anteverted uterus,normal ov's bilat(mobile),EEC 3.3 mm,small amount of fluid w/in the endometrium,mult small nabothian cysts,no free fluid,no pain during ultrasound [employer] 10/08/2015 10:09 AM Clinical Impression and recommendations: I have reviewed the sonogram results above, combined with the patient's current clinical course, below are my impressions and any appropriate recommendations for management based on the sonographic findings. Normal uterus and endometrium, no pathology noted as cause of abnormal bleeding Ovaries are normal Renee Williams 10/08/2015 10:16 AM       Assessment: Menometrorrhagia Dysmenorrhea     Plan: Hysteroscopy uterine curettage endometrial ablation Considering cholecystectomy due to chronic gallstones  Renee Williams 10/08/2015 10:27 AM

## 2015-10-09 ENCOUNTER — Ambulatory Visit: Payer: 59 | Admitting: Neurology

## 2015-10-25 ENCOUNTER — Ambulatory Visit (INDEPENDENT_AMBULATORY_CARE_PROVIDER_SITE_OTHER): Payer: 59 | Admitting: Physician Assistant

## 2015-10-25 VITALS — BP 124/80 | HR 104 | Temp 98.5°F | Resp 16 | Ht 65.5 in | Wt 164.0 lb

## 2015-10-25 DIAGNOSIS — R05 Cough: Secondary | ICD-10-CM | POA: Diagnosis not present

## 2015-10-25 DIAGNOSIS — R059 Cough, unspecified: Secondary | ICD-10-CM

## 2015-10-25 MED ORDER — AZITHROMYCIN 250 MG PO TABS: ORAL_TABLET | ORAL | Status: AC

## 2015-10-25 MED ORDER — MOMETASONE FUROATE 50 MCG/ACT NA SUSP: 2.0000 | Freq: Every day | NASAL | Status: DC

## 2015-10-25 MED ORDER — BENZONATATE 100 MG PO CAPS: ORAL_CAPSULE | ORAL | Status: AC

## 2015-10-25 NOTE — Patient Instructions (Signed)
     IF you received an x-ray today, you will receive an invoice from [facility]. Please contact [facility] at [phone] with questions or concerns regarding your invoice.   IF you received labwork today, you will receive an invoice from [facility]. Please contact [facility] at [phone] with questions or concerns regarding your invoice.   Our billing staff will not be able to assist you with questions regarding bills from these companies.  You will be contacted with the lab results as soon as they are available. The fastest way to get your results is to activate your My Chart account. Instructions are located on the last page of this paperwork. If you have not heard from us regarding the results in 2 weeks, please contact this office.      

## 2015-10-25 NOTE — Progress Notes (Signed)
   Renee Williams  MRN: [id number] DOB: 09/05/1970  Subjective:  Pt presents to clinic with cough for about 2 weeks - she has been using mucinex to help with the chest congestion.  She has childhood asthma and does have a inhaler at home but it has not helped with the cough.  No SOB or chest pain - she is coughing so hard she is throwing up.  The cough is dry. 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 BMI 28.0-28.9,adult 04/13/2015    Current Outpatient Prescriptions on File Prior to Visit  Medication Sig Dispense Refill  . buPROPion (WELLBUTRIN XL) 150 MG 24 hr tablet Take 150 mg by mouth daily.   0  . cyclobenzaprine (FLEXERIL) 10 MG tablet Take 1 tablet (10 mg total) by mouth at bedtime. 10 tablet 0  . Krill Oil Omega-3 300 MG CAPS Take by mouth.    . lidocaine (LIDODERM) 5 % Place 1 patch onto the skin daily. Remove & Discard patch within 12 hours or as directed by MD    . megestrol (MEGACE) 40 MG tablet 2 tablets daily 60 tablet 3  . TOPAMAX 50 MG tablet Take 1 tablet (50 mg total) by mouth at bedtime. 30 tablet 1   No current facility-administered medications on file prior to visit.    No Known Allergies  Review of Systems  Constitutional: Negative for fever and chills.  HENT: Negative for congestion, postnasal drip, rhinorrhea and sore throat.   Respiratory: Positive for cough. Negative for shortness of breath and wheezing.        H/o childhood asthma   Objective:  BP 124/80 mmHg  Pulse 104  Temp(Src) 98.5 F (36.9 C) (Oral)  Resp 16  Ht 5' 5.5" (1.664 m)  Wt 164 lb (74.39 kg)  BMI 26.87 kg/m2  SpO2 99%  LMP 10/07/2015 (Exact Date)  Physical Exam  Constitutional: She is oriented to person, place, and time and well-developed, well-nourished, and in no distress.  HENT:  Head: Normocephalic and atraumatic.  Right Ear: Hearing,  tympanic membrane, external ear and ear canal normal.  Left Ear: Hearing, tympanic membrane, external ear and ear canal normal.  Nose: Mucosal edema (pale) present.  Mouth/Throat: Uvula is midline, oropharynx is clear and moist and mucous membranes are normal.  Eyes: Conjunctivae are normal.  Neck: Normal range of motion.  Cardiovascular: Normal rate, regular rhythm and normal heart sounds.   No murmur heard. Pulmonary/Chest: Effort normal and breath sounds normal.  Neurological: She is alert and oriented to person, place, and time. Gait normal.  Skin: Skin is warm and dry.  Psychiatric: Mood, memory, affect and judgment normal.  Vitals reviewed.   Assessment and Plan :  Cough - Plan: azithromycin (ZITHROMAX Z-PAK) 250 MG tablet, benzonatate (TESSALON) 100 MG capsule, mometasone (NASONEX) 50 MCG/ACT nasal spray   Due to length of time cover for atypical bacteria.  I suspect that allergies are a large cause of her cough and she will return to using an OTC antihistamine and she will start nasonex.  Windell Hummingbird PA-C  [facility] 10/25/2015 8:18 PM

## 2015-10-29 ENCOUNTER — Other Ambulatory Visit: Payer: Self-pay | Admitting: Neurology

## 2015-11-15 ENCOUNTER — Other Ambulatory Visit (INDEPENDENT_AMBULATORY_CARE_PROVIDER_SITE_OTHER): Payer: 59

## 2015-11-15 ENCOUNTER — Ambulatory Visit (INDEPENDENT_AMBULATORY_CARE_PROVIDER_SITE_OTHER): Payer: 59 | Admitting: Neurology

## 2015-11-15 ENCOUNTER — Encounter: Payer: Self-pay | Admitting: Neurology

## 2015-11-15 VITALS — BP 112/68 | HR 98 | Ht 65.0 in | Wt 166.0 lb

## 2015-11-15 DIAGNOSIS — Z79899 Other long term (current) drug therapy: Secondary | ICD-10-CM

## 2015-11-15 LAB — BASIC METABOLIC PANEL
BUN: 15 mg/dL (ref 6–23)
CHLORIDE: 111 meq/L (ref 96–112)
CO2: 21 meq/L (ref 19–32)
Calcium: 9.3 mg/dL (ref 8.4–10.5)
Creatinine, Ser: 0.74 mg/dL (ref 0.40–1.20)
GFR: 91.2 mL/min (ref >60.00)
Glucose, Bld: 132 mg/dL — ABNORMAL HIGH (ref 70–99)
POTASSIUM: 3.6 meq/L (ref 3.5–5.1)
SODIUM: 142 meq/L (ref 135–145)

## 2015-11-15 MED ORDER — SUMATRIPTAN SUCCINATE 100 MG PO TABS: ORAL_TABLET | ORAL | Status: DC

## 2015-11-15 MED ORDER — TOPIRAMATE 25 MG PO TABS: 75.0000 mg | ORAL_TABLET | Freq: Every day | ORAL | Status: DC

## 2015-11-15 MED FILL — TOPIRAMATE 25 MG TABLET: 25 | 30 days supply | Qty: 90 | Fill #0

## 2015-11-15 MED FILL — BUPROPION HCL XL 150 MG TAB: 150 | 90 days supply | Qty: 90 | Fill #0

## 2015-11-15 MED FILL — SUMATRIPTAN SUCC 100 MG TAB: 100 | 30 days supply | Qty: 9 | Fill #0
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
## 2015-11-15 NOTE — Patient Instructions (Addendum)
1.  I will prescribe you topiramate 25mg  tablets.  Take 3 tablets at bedtime.  Contact us in 4 weeks with update. Migraine Recommendations:. 2.  Take sumatriptan 100mg  at earliest onset of headache.  May repeat dose once in 2 hours if needed.  Do not exceed two tablets in 24 hours. 3.  Limit use of pain relievers to no more than 2 days out of the week.  These medications include acetaminophen, ibuprofen, triptans and narcotics.  This will help reduce risk of rebound headaches. 4.  Be aware of common food triggers such as processed sweets, processed foods with nitrites (such as deli meat, hot dogs, sausages), foods with MSG, alcohol (such as wine), chocolate, certain cheeses, certain fruits (dried fruits, some citrus fruit), vinegar, diet soda. 4.  Avoid caffeine 5.  Routine exercise 6.  Proper sleep hygiene 7.  Stay adequately hydrated with water 8.  Keep a headache diary. 9.  Maintain proper stress management. 10.  Do not skip meals. 11.  Consider supplements:  Magnesium oxide 400mg  to 600mg  daily, riboflavin 400mg , Coenzyme Q 10 100mg  three times daily 12.  Follow up in 3 months.  Sumatriptan tablets What is this medicine? SUMATRIPTAN (soo ma TRIP tan) is used to treat migraines with or without aura. An aura is a strange feeling or visual disturbance that warns you of an attack. It is not used to prevent migraines. This medicine may be used for other purposes; ask your health care provider or pharmacist if you have questions. What should I tell my health care provider before I take this medicine? They need to know if you have any of these conditions: -circulation problems in fingers and toes -diabetes -heart disease -high blood pressure -high cholesterol -history of irregular heartbeat -history of stroke -kidney disease -liver disease -postmenopausal or surgical removal of uterus and ovaries -seizures -smoke tobacco -stomach or intestine problems -an unusual or allergic reaction  to sumatriptan, other medicines, foods, dyes, or preservatives -pregnant or trying to get pregnant -breast-feeding How should I use this medicine? Take this medicine by mouth with a glass of water. Follow the directions on the prescription label. This medicine is taken at the first symptoms of a migraine. It is not for everyday use. If your migraine headache returns after one dose, you can take another dose as directed. You must leave at least 2 hours between doses, and do not take more than 100 mg as a single dose. Do not take more than 200 mg total in any 24 hour period. If there is no improvement at all after the first dose, do not take a second dose without talking to your doctor or health care professional. Do not take your medicine more often than directed. Talk to your pediatrician regarding the use of this medicine in children. Special care may be needed. Overdosage: If you think you have taken too much of this medicine contact a poison control center or emergency room at once. NOTE: This medicine is only for you. Do not share this medicine with others. What if I miss a dose? This does not apply; this medicine is not for regular use. What may interact with this medicine? Do not take this medicine with any of the following medicines: -cocaine -ergot alkaloids like dihydroergotamine, ergonovine, ergotamine, methylergonovine -feverfew -MAOIs like Carbex, Eldepryl, Marplan, Nardil, and Parnate -other medicines for migraine headache like almotriptan, eletriptan, frovatriptan, naratriptan, rizatriptan, zolmitriptan -tryptophan This medicine may also interact with the following medications: -certain medicines for depression, anxiety, or  psychotic disturbances This list may not describe all possible interactions. Give your health care provider a list of all the medicines, herbs, non-prescription drugs, or dietary supplements you use. Also tell them if you smoke, drink alcohol, or use illegal  drugs. Some items may interact with your medicine. What should I watch for while using this medicine? Only take this medicine for a migraine headache. Take it if you get warning symptoms or at the start of a migraine attack. It is not for regular use to prevent migraine attacks. You may get drowsy or dizzy. Do not drive, use machinery, or do anything that needs mental alertness until you know how this medicine affects you. To reduce dizzy or fainting spells, do not sit or stand up quickly, especially if you are an older patient. Alcohol can increase drowsiness, dizziness and flushing. Avoid alcoholic drinks. Smoking cigarettes may increase the risk of heart-related side effects from using this medicine. If you take migraine medicines for 10 or more days a month, your migraines may get worse. Keep a diary of headache days and medicine use. Contact your healthcare professional if your migraine attacks occur more frequently. What side effects may I notice from receiving this medicine? Side effects that you should report to your doctor or health care professional as soon as possible: -allergic reactions like skin rash, itching or hives, swelling of the face, lips, or tongue -bloody or watery diarrhea -hallucination, loss of contact with reality -pain, tingling, numbness in the face, hands, or feet -seizures -signs and symptoms of a blood clot such as breathing problems; changes in vision; chest pain; severe, sudden headache; pain, swelling, warmth in the leg; trouble speaking; sudden numbness or weakness of the face, arm, or leg -signs and symptoms of a dangerous change in heartbeat or heart rhythm like chest pain; dizziness; fast or irregular heartbeat; palpitations, feeling faint or lightheaded; falls; breathing problems -signs and symptoms of a stroke like changes in vision; confusion; trouble speaking or understanding; severe headaches; sudden numbness or weakness of the face, arm, or leg; trouble  walking; dizziness; loss of balance or coordination -stomach pain Side effects that usually do not require medical attention (report these to your doctor or health care professional if they continue or are bothersome): -changes in taste -facial flushing -headache -muscle cramps -muscle pain -nausea, vomiting -weak or tired This list may not describe all possible side effects. Call your doctor for medical advice about side effects. You may report side effects to FDA at 1-800-FDA-1088. Where should I keep my medicine? Keep out of the reach of children. Store at room temperature between 2 and 30 degrees C (36 and 86 degrees F). Throw away any unused medicine after the expiration date. NOTE: This sheet is a summary. It may not cover all possible information. If you have questions about this medicine, talk to your doctor, pharmacist, or health care provider.    2016, Elsevier/Gold Standard. (2014-10-25 17:46:40)

## 2015-11-15 NOTE — Progress Notes (Signed)
Chart forwarded.  

## 2015-11-18 ENCOUNTER — Encounter: Payer: Self-pay | Admitting: *Deleted

## 2015-11-18 ENCOUNTER — Ambulatory Visit
Admission: EM | Admit: 2015-11-18 | Discharge: 2015-11-18 | Disposition: A | Discharge status: Home / Self Care | Payer: 59 | Attending: Family Medicine | Admitting: Family Medicine

## 2015-11-18 DIAGNOSIS — M25561 Pain in right knee: Secondary | ICD-10-CM | POA: Diagnosis not present

## 2015-11-18 DIAGNOSIS — M25562 Pain in left knee: Secondary | ICD-10-CM | POA: Diagnosis not present

## 2015-11-18 MED ORDER — MELOXICAM 15 MG PO TABS: 15.0000 mg | ORAL_TABLET | Freq: Every day | ORAL | Status: DC | PRN

## 2015-11-18 MED ORDER — OXYCODONE-ACETAMINOPHEN 5-325 MG PO TABS: 1.0000 | ORAL_TABLET | Freq: Three times a day (TID) | ORAL | Status: DC | PRN
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
## 2015-11-18 NOTE — Discharge Instructions (Signed)
Take medication as prescribed.   Follow up with your primary care physician.Follow up with orthopedic as discussed. See above.   Return to Urgent care for new or worsening concerns.    Knee Pain Knee pain is a very common symptom and can have many causes. Knee pain often goes away when you follow your health care provider's instructions for relieving pain and discomfort at home. However, knee pain can develop into a condition that needs treatment. Some conditions may include:  Arthritis caused by wear and tear (osteoarthritis).  Arthritis caused by swelling and irritation (rheumatoid arthritis or gout).  A cyst or growth in your knee.  An infection in your knee joint.  An injury that will not heal.  Damage, swelling, or irritation of the tissues that support your knee (torn ligaments or tendinitis). If your knee pain continues, additional tests may be ordered to diagnose your condition. Tests may include X-rays or other imaging studies of your knee. You may also need to have fluid removed from your knee. Treatment for ongoing knee pain depends on the cause, but treatment may include:  Medicines to relieve pain or swelling.  Steroid injections in your knee.  Physical therapy.  Surgery. HOME CARE INSTRUCTIONS  Take medicines only as directed by your health care provider.  Rest your knee and keep it raised (elevated) while you are resting.  Do not do things that cause or worsen pain.  Avoid high-impact activities or exercises, such as running, jumping rope, or doing jumping jacks.  Apply ice to the knee area:  Put ice in a plastic bag.  Place a towel between your skin and the bag.  Leave the ice on for 20 minutes, 2-3 times a day.  Ask your health care provider if you should wear an elastic knee support.  Keep a pillow under your knee when you sleep.  Lose weight if you are overweight. Extra weight can put pressure on your knee.  Do not use any tobacco products,  including cigarettes, chewing tobacco, or electronic cigarettes. If you need help quitting, ask your health care provider. Smoking may slow the healing of any bone and joint problems that you may have. SEEK MEDICAL CARE IF:  Your knee pain continues, changes, or gets worse.  You have a fever along with knee pain.  Your knee buckles or locks up.  Your knee becomes more swollen. SEEK IMMEDIATE MEDICAL CARE IF:   Your knee joint feels hot to the touch.  You have chest pain or trouble breathing.   This information is not intended to replace advice given to you by your health care provider. Make sure you discuss any questions you have with your health care provider.   Document Released: 02/15/2007 Document Revised: 05/11/2014 Document Reviewed: 12/04/2013 Elsevier Interactive Patient Education 2016 [place] Pain Joint pain, which is also called arthralgia, can be caused by many things. Joint pain often goes away when you follow your health care provider's instructions for relieving pain at home. However, joint pain can also be caused by conditions that require further treatment. Common causes of joint pain include:  Bruising in the area of the joint.  Overuse of the joint.  Wear and tear on the joints that occur with aging (osteoarthritis).  Various other forms of arthritis.  A buildup of a crystal form of uric acid in the joint (gout).  Infections of the joint (septic arthritis) or of the bone (osteomyelitis). Your health care provider may recommend medicine to help  with the pain. If your joint pain continues, additional tests may be needed to diagnose your condition. HOME CARE INSTRUCTIONS Watch your condition for any changes. Follow these instructions as directed to lessen the pain that you are feeling.  Take medicines only as directed by your health care provider.  Rest the affected area for as long as your health care provider says that you should. If directed to  do so, raise the painful joint above the level of your heart while you are sitting or lying down.  Do not do things that cause or worsen pain.  If directed, apply ice to the painful area:  Put ice in a plastic bag.  Place a towel between your skin and the bag.  Leave the ice on for 20 minutes, 2-3 times per day.  Wear an elastic bandage, splint, or sling as directed by your health care provider. Loosen the elastic bandage or splint if your fingers or toes become numb and tingle, or if they turn cold and blue.  Begin exercising or stretching the affected area as directed by your health care provider. Ask your health care provider what types of exercise are safe for you.  Keep all follow-up visits as directed by your health care provider. This is important. SEEK MEDICAL CARE IF:  Your pain increases, and medicine does not help.  Your joint pain does not improve within 3 days.  You have increased bruising or swelling.  You have a fever.  You lose 10 lb (4.5 kg) or more without trying. SEEK IMMEDIATE MEDICAL CARE IF:  You are not able to move the joint.  Your fingers or toes become numb or they turn cold and blue.   This information is not intended to replace advice given to you by your health care provider. Make sure you discuss any questions you have with your health care provider.   Document Released: 04/20/2005 Document Revised: 05/11/2014 Document Reviewed: 01/30/2014 Elsevier Interactive Patient Education [employer].
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
## 2015-11-26 NOTE — ED Notes (Signed)
Faxed over Referral information to Dr Estanislado [facility]

## 2015-12-03 ENCOUNTER — Telehealth: Payer: 59 | Admitting: Nurse Practitioner

## 2015-12-03 DIAGNOSIS — M79643 Pain in unspecified hand: Secondary | ICD-10-CM

## 2015-12-03 MED ORDER — MELOXICAM 15 MG PO TABS: 15.0000 mg | ORAL_TABLET | Freq: Every day | ORAL | 3 refills | Status: DC

## 2015-12-03 NOTE — Progress Notes (Signed)
I will refill your mobic. Keep appointment with rheumatologost.

## 2015-12-16 MED FILL — TOPIRAMATE 25 MG TABLET: 25 | 30 days supply | Qty: 90 | Fill #1

## 2015-12-19 ENCOUNTER — Encounter: Payer: 59 | Admitting: Obstetrics & Gynecology

## 2015-12-30 MED FILL — SUMATRIPTAN SUCC 100 MG TAB: 100 | 30 days supply | Qty: 9 | Fill #1

## 2016-01-02 ENCOUNTER — Telehealth: Payer: Self-pay | Admitting: Neurology

## 2016-01-02 NOTE — Telephone Encounter (Signed)
Left message on machine for patient to call back.

## 2016-01-02 NOTE — Telephone Encounter (Signed)
Renee Williams [birth year]. Her # is [phone]. She has been taking Topamax every day then imitrex as need. Not helping . Having nausea. She was not sure what to do. This has been going on since Monday 8/28. Thank you

## 2016-01-03 NOTE — Telephone Encounter (Signed)
Left another message for patient to call back 

## 2016-01-09 ENCOUNTER — Ambulatory Visit: Payer: 59 | Admitting: Gynecology

## 2016-01-13 DIAGNOSIS — D509 Iron deficiency anemia, unspecified: Secondary | ICD-10-CM | POA: Diagnosis not present

## 2016-01-13 DIAGNOSIS — F419 Anxiety disorder, unspecified: Secondary | ICD-10-CM | POA: Diagnosis not present

## 2016-01-13 DIAGNOSIS — M549 Dorsalgia, unspecified: Secondary | ICD-10-CM | POA: Diagnosis not present

## 2016-01-13 DIAGNOSIS — F329 Major depressive disorder, single episode, unspecified: Secondary | ICD-10-CM | POA: Diagnosis not present

## 2016-01-14 ENCOUNTER — Telehealth: Payer: Self-pay | Admitting: Neurology

## 2016-01-14 MED ORDER — ONDANSETRON 4 MG PO TBDP: 4.0000 mg | ORAL_TABLET | Freq: Three times a day (TID) | ORAL | 0 refills | Status: DC | PRN

## 2016-01-14 MED ORDER — RIZATRIPTAN BENZOATE 10 MG PO TBDP: 10.0000 mg | ORAL_TABLET | ORAL | 11 refills | Status: DC | PRN

## 2016-01-14 NOTE — Telephone Encounter (Signed)
PT aware. RX sent to pharmacy

## 2016-01-14 NOTE — Telephone Encounter (Signed)
Instead of sumatriptan, she can try Maxalt 10mg  (at earliest onset of headache and may repeat once in 2 hours if needed, not to exceed 2 tablets in 24 hours)  For nausea, she can try Zofran 4mg  ODT every 8 hours as needed.

## 2016-01-14 NOTE — Telephone Encounter (Signed)
Spoke with patient. Today is the first day she has ever taken 2 sumatriptan in 1 day. She is having a "hot sensation" all over her body. It has gotten better since it first started. She is nauseous, unknown if this is from medication or migraine, but pt stated its never been this bad before. Pt would like to know if there is something besides imitrex she may take, and also would like to know if she can get something for nausea. Please advise.

## 2016-01-14 NOTE — Telephone Encounter (Signed)
PT has a question about medication/Dawn  CB# [phone]

## 2016-01-15 MED FILL — RIZATRIPTAN 10 MG ODT: 10 | 30 days supply | Qty: 9 | Fill #0

## 2016-01-15 MED FILL — TOPIRAMATE 25 MG TABLET: 25 | 30 days supply | Qty: 90 | Fill #2 | Status: TO

## 2016-01-15 MED FILL — ONDANSETRON ODT 4 MG TABLET: 4 | 7 days supply | Qty: 20 | Fill #0

## 2016-01-24 DIAGNOSIS — M79601 Pain in right arm: Secondary | ICD-10-CM | POA: Diagnosis not present

## 2016-01-27 ENCOUNTER — Ambulatory Visit (INDEPENDENT_AMBULATORY_CARE_PROVIDER_SITE_OTHER): Payer: 59 | Admitting: Gynecology

## 2016-01-27 ENCOUNTER — Encounter: Payer: Self-pay | Admitting: Gynecology

## 2016-01-27 VITALS — BP 114/78 | Ht 65.0 in | Wt 174.0 lb

## 2016-01-27 DIAGNOSIS — Z124 Encounter for screening for malignant neoplasm of cervix: Secondary | ICD-10-CM

## 2016-01-27 DIAGNOSIS — N938 Other specified abnormal uterine and vaginal bleeding: Secondary | ICD-10-CM | POA: Insufficient documentation

## 2016-01-27 DIAGNOSIS — N858 Other specified noninflammatory disorders of uterus: Secondary | ICD-10-CM | POA: Diagnosis not present

## 2016-01-27 LAB — CBC WITH DIFFERENTIAL/PLATELET
BASOS ABS: 0 {cells}/uL (ref 0–200)
Basophils Relative: 0 %
EOS PCT: 4 %
Eosinophils Absolute: 212 cells/uL (ref 15–500)
HEMATOCRIT: 38.8 % (ref 35.0–45.0)
HEMOGLOBIN: 13.1 g/dL (ref 11.7–15.5)
LYMPHS ABS: 1325 {cells}/uL (ref 850–3900)
Lymphocytes Relative: 25 %
MCH: 32.4 pg (ref 27.0–33.0)
MCHC: 33.8 g/dL (ref 32.0–36.0)
MCV: 96 fL (ref 80.0–100.0)
MONO ABS: 371 {cells}/uL (ref 200–950)
MPV: 8.9 fL (ref 7.5–12.5)
Monocytes Relative: 7 %
NEUTROS ABS: 3392 {cells}/uL (ref 1500–7800)
Neutrophils Relative %: 64 %
Platelets: 214 10*3/uL (ref 140–400)
RBC: 4.04 MIL/uL (ref 3.80–5.10)
RDW: 13.3 % (ref 11.0–15.0)
WBC: 5.3 10*3/uL (ref 3.8–10.8)

## 2016-01-27 NOTE — Addendum Note (Signed)
Addended by: Terrance Mass on: 01/27/2016 10:54 AM   Modules accepted: Orders
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
## 2016-01-27 NOTE — Patient Instructions (Addendum)
ENDOMETRIAL BIOPSY POST-PROCEDURE INSTRUCTIONS  1. You may take Ibuprofen, Aleve or Tylenol for pain if needed.  Cramping should resolve within in 24 hours.  2. You may have a small amount of spotting.  You should wear a mini pad for the next few days.  3. You may have intercourse after 24 hours.  4. You need to call if you have any pelvic pain, fever, heavy bleeding or foul smelling vaginal discharge.  5. Shower or bathe as normal  6. We will call you within one week with results or we will discuss   the results at your follow-up appointment if needed.     Endometrial Ablation Endometrial ablation removes the lining of the uterus (endometrium). It is usually a same-day, outpatient treatment. Ablation helps avoid major surgery, such as surgery to remove the cervix and uterus (hysterectomy). After endometrial ablation, you will have little or no menstrual bleeding and may not be able to have children. However, if you are premenopausal, you will need to use a reliable method of birth control following the procedure because of the small chance that pregnancy can occur. There are different reasons to have this procedure. These reasons include:  Heavy periods.  Bleeding that is causing anemia.  Irregular bleeding.  Bleeding fibroids on the lining inside the uterus if they are smaller than 3 centimeters. This procedure may not be possible for you if:   You want to have children in the future.   You have severe cramps with your menstrual period.   You have precancerous or cancerous cells in your uterus.   You were recently pregnant.   You have gone through menopause.   You have had major surgery on your uterus, resulting in thinning of the uterine wall. Surgeries may include:  The removal of one or more uterine fibroids (myomectomy).  A cesarean section with a classic (vertical) incision on your uterus. Ask your health care provider what type of cesarean you had. Sometimes  the scar on your skin is different than the scar on your uterus. Even if you have had surgery on your uterus, certain types of ablation may still be safe for you. Talk with your health care provider. LET [facility] CARE PROVIDER KNOW ABOUT:  Any allergies you have.  All medicines you are taking, including vitamins, herbs, eye drops, creams, and over-the-counter medicines.  Previous problems you or members of your family have had with the use of anesthetics.  Any blood disorders you have.  Previous surgeries you have had.  Medical conditions you have. RISKS AND COMPLICATIONS  Generally, this is a safe procedure. However, as with any procedure, complications can occur. Possible complications include:  Perforation of the uterus.  Bleeding.  Infection of the uterus, bladder, or vagina.  Injury to surrounding organs.  An air bubble to the lung (air embolus).  Pregnancy following the procedure.  Failure of the procedure to help the problem, requiring hysterectomy.  Decreased ability to diagnose cancer in the lining of the uterus. BEFORE THE PROCEDURE  The lining of the uterus must be tested to make sure there is no pre-cancerous or cancer cells present.  An ultrasound may be performed to look at the size of the uterus and to check for abnormalities.  Medicines may be given to thin the lining of the uterus. PROCEDURE  During the procedure, your health care provider will use a tool called a resectoscope to help see inside your uterus. There are different ways to remove the lining of  your uterus.   Radiofrequency - This method uses a radiofrequency-alternating electric current to remove the lining of the uterus.  Cryotherapy - This method uses extreme cold to freeze the lining of the uterus.  Heated-Free Liquid - This method uses heated salt (saline) solution to remove the lining of the uterus.  Microwave - This method uses high-energy microwaves to heat up the lining of the  uterus to remove it.  Thermal balloon - This method involves inserting a catheter with a balloon tip into the uterus. The balloon tip is filled with heated fluid to remove the lining of the uterus. AFTER THE PROCEDURE  After your procedure, do not have sexual intercourse or insert anything into your vagina until permitted by your health care provider. After the procedure, you may experience:  Cramps.  Vaginal discharge.  Frequent urination.   This information is not intended to replace advice given to you by your health care provider. Make sure you discuss any questions you have with your health care provider.   Document Released: 02/28/2004 Document Revised: 01/09/2015 Document Reviewed: 09/21/2012 Elsevier Interactive Patient Education 2016 [employer]. Levonorgestrel intrauterine device (IUD) What is this medicine? LEVONORGESTREL IUD (LEE voe nor jes trel) is a contraceptive (birth control) device. The device is placed inside the uterus by a healthcare professional. It is used to prevent pregnancy and can also be used to treat heavy bleeding that occurs during your period. Depending on the device, it can be used for 3 to 5 years. This medicine may be used for other purposes; ask your health care provider or pharmacist if you have questions. What should I tell my health care provider before I take this medicine? They need to know if you have any of these conditions: -abnormal Pap smear -cancer of the breast, uterus, or cervix -diabetes -endometritis -genital or pelvic infection now or in the past -have more than one sexual partner or your partner has more than one partner -heart disease -history of an ectopic or tubal pregnancy -immune system problems -IUD in place -liver disease or tumor -problems with blood clots or take blood-thinners -use intravenous drugs -uterus of unusual shape -vaginal bleeding that has not been explained -an unusual or allergic reaction to  levonorgestrel, other hormones, silicone, or polyethylene, medicines, foods, dyes, or preservatives -pregnant or trying to get pregnant -breast-feeding How should I use this medicine? This device is placed inside the uterus by a health care professional. Talk to your pediatrician regarding the use of this medicine in children. Special care may be needed. Overdosage: If you think you have taken too much of this medicine contact a poison control center or emergency room at once. NOTE: This medicine is only for you. Do not share this medicine with others. What if I miss a dose? This does not apply. What may interact with this medicine? Do not take this medicine with any of the following medications: -amprenavir -bosentan -fosamprenavir This medicine may also interact with the following medications: -aprepitant -barbiturate medicines for inducing sleep or treating seizures -bexarotene -griseofulvin -medicines to treat seizures like carbamazepine, ethotoin, felbamate, oxcarbazepine, phenytoin, topiramate -modafinil -pioglitazone -rifabutin -rifampin -rifapentine -some medicines to treat HIV infection like atazanavir, indinavir, lopinavir, nelfinavir, tipranavir, ritonavir -St. John's wort -warfarin This list may not describe all possible interactions. Give your health care provider a list of all the medicines, herbs, non-prescription drugs, or dietary supplements you use. Also tell them if you smoke, drink alcohol, or use illegal drugs. Some items may interact with your  medicine. What should I watch for while using this medicine? Visit your doctor or health care professional for regular check ups. See your doctor if you or your partner has sexual contact with others, becomes HIV positive, or gets a sexual transmitted disease. This product does not protect you against HIV infection (AIDS) or other sexually transmitted diseases. You can check the placement of the IUD yourself by reaching up  to the top of your vagina with clean fingers to feel the threads. Do not pull on the threads. It is a good habit to check placement after each menstrual period. Call your doctor right away if you feel more of the IUD than just the threads or if you cannot feel the threads at all. The IUD may come out by itself. You may become pregnant if the device comes out. If you notice that the IUD has come out use a backup birth control method like condoms and call your health care provider. Using tampons will not change the position of the IUD and are okay to use during your period. What side effects may I notice from receiving this medicine? Side effects that you should report to your doctor or health care professional as soon as possible: -allergic reactions like skin rash, itching or hives, swelling of the face, lips, or tongue -fever, flu-like symptoms -genital sores -high blood pressure -no menstrual period for 6 weeks during use -pain, swelling, warmth in the leg -pelvic pain or tenderness -severe or sudden headache -signs of pregnancy -stomach cramping -sudden shortness of breath -trouble with balance, talking, or walking -unusual vaginal bleeding, discharge -yellowing of the eyes or skin Side effects that usually do not require medical attention (report to your doctor or health care professional if they continue or are bothersome): -acne -breast pain -change in sex drive or performance -changes in weight -cramping, dizziness, or faintness while the device is being inserted -headache -irregular menstrual bleeding within first 3 to 6 months of use -nausea This list may not describe all possible side effects. Call your doctor for medical advice about side effects. You may report side effects to FDA at 1-800-FDA-1088. Where should I keep my medicine? This does not apply. NOTE: This sheet is a summary. It may not cover all possible information. If you have questions about this medicine, talk  to your doctor, pharmacist, or health care provider.    2016, Elsevier/Gold Standard. (2011-05-21 13:54:04)

## 2016-01-28 ENCOUNTER — Other Ambulatory Visit: Payer: Self-pay | Admitting: Gynecology

## 2016-01-28 ENCOUNTER — Other Ambulatory Visit (INDEPENDENT_AMBULATORY_CARE_PROVIDER_SITE_OTHER): Payer: Self-pay | Admitting: Orthopaedic Surgery

## 2016-01-28 DIAGNOSIS — M898X2 Other specified disorders of bone, upper arm: Secondary | ICD-10-CM

## 2016-01-28 DIAGNOSIS — N939 Abnormal uterine and vaginal bleeding, unspecified: Secondary | ICD-10-CM

## 2016-01-28 DIAGNOSIS — M7989 Other specified soft tissue disorders: Secondary | ICD-10-CM

## 2016-01-28 DIAGNOSIS — M79621 Pain in right upper arm: Secondary | ICD-10-CM

## 2016-01-28 LAB — PAP IG W/ RFLX HPV ASCU

## 2016-02-05 ENCOUNTER — Telehealth: Payer: Self-pay | Admitting: *Deleted

## 2016-02-05 MED ORDER — MEGESTROL ACETATE 40 MG PO TABS: ORAL_TABLET | ORAL | 0 refills | Status: DC

## 2016-02-05 MED FILL — MEGESTROL 40 MG TABLET: 40 | 5 days supply | Qty: 10 | Fill #0

## 2016-02-05 NOTE — Telephone Encounter (Signed)
Rx sent 

## 2016-02-05 NOTE — Telephone Encounter (Signed)
Pt has [facility] scheduled on 02/10/16 needs refill on megace 40 mg tablets twice daily. Please advise

## 2016-02-05 NOTE — Telephone Encounter (Signed)
Please call in refills for 10 more days

## 2016-02-06 ENCOUNTER — Ambulatory Visit (HOSPITAL_COMMUNITY)
Admission: RE | Admit: 2016-02-06 | Discharge: 2016-02-06 | Disposition: A | Discharge status: Home / Self Care | Payer: 59 | Source: Ambulatory Visit | Attending: Orthopaedic Surgery | Admitting: Orthopaedic Surgery

## 2016-02-06 DIAGNOSIS — M79621 Pain in right upper arm: Secondary | ICD-10-CM | POA: Diagnosis not present

## 2016-02-06 DIAGNOSIS — D179 Benign lipomatous neoplasm, unspecified: Secondary | ICD-10-CM | POA: Insufficient documentation

## 2016-02-06 DIAGNOSIS — M898X2 Other specified disorders of bone, upper arm: Secondary | ICD-10-CM

## 2016-02-06 DIAGNOSIS — M7989 Other specified soft tissue disorders: Secondary | ICD-10-CM

## 2016-02-06 MED FILL — BUPROPION HCL XL 150 MG TAB: 150 | 90 days supply | Qty: 90 | Fill #1

## 2016-02-07 ENCOUNTER — Telehealth: Payer: Self-pay

## 2016-02-07 NOTE — Telephone Encounter (Signed)
I spoke with rep at [facility].  Because we are a part of [facility] both Mirena and Her Option Ablation are covered at 100%.  Both are covered based on medical necessity.

## 2016-02-10 ENCOUNTER — Ambulatory Visit (INDEPENDENT_AMBULATORY_CARE_PROVIDER_SITE_OTHER): Payer: 59 | Admitting: Gynecology

## 2016-02-10 ENCOUNTER — Ambulatory Visit: Payer: 59 | Admitting: Gynecology

## 2016-02-10 ENCOUNTER — Ambulatory Visit (INDEPENDENT_AMBULATORY_CARE_PROVIDER_SITE_OTHER): Payer: 59

## 2016-02-10 ENCOUNTER — Encounter: Payer: Self-pay | Admitting: Gynecology

## 2016-02-10 ENCOUNTER — Other Ambulatory Visit: Payer: 59

## 2016-02-10 DIAGNOSIS — N921 Excessive and frequent menstruation with irregular cycle: Secondary | ICD-10-CM

## 2016-02-10 DIAGNOSIS — N938 Other specified abnormal uterine and vaginal bleeding: Secondary | ICD-10-CM | POA: Diagnosis not present

## 2016-02-10 DIAGNOSIS — N939 Abnormal uterine and vaginal bleeding, unspecified: Secondary | ICD-10-CM

## 2016-02-10 MED FILL — RIZATRIPTAN 10 MG ODT: 10 | 30 days supply | Qty: 9 | Fill #1

## 2016-02-10 MED FILL — PROGESTERONE 200 MG CAPSULE: 200 | 12 days supply | Qty: 12 | Fill #0

## 2016-02-10 NOTE — Patient Instructions (Signed)
Endometrial Ablation °Endometrial ablation removes the lining of the uterus (endometrium). It is usually a same-day, outpatient treatment. Ablation helps avoid major surgery, such as surgery to remove the cervix and uterus (hysterectomy). After endometrial ablation, you will have little or no menstrual bleeding and may not be able to have children. However, if you are premenopausal, you will need to use a reliable method of birth control following the procedure because of the small chance that pregnancy can occur. °There are different reasons to have this procedure. These reasons include: °· Heavy periods. °· Bleeding that is causing anemia. °· Irregular bleeding. °· Bleeding fibroids on the lining inside the uterus if they are smaller than 3 centimeters. °This procedure may not be possible for you if:  °· You want to have children in the future.   °· You have severe cramps with your menstrual period.   °· You have precancerous or cancerous cells in your uterus.   °· You were recently pregnant.   °· You have gone through menopause.   °· You have had major surgery on your uterus, resulting in thinning of the uterine wall. Surgeries may include: °¨ The removal of one or more uterine fibroids (myomectomy). °¨ A cesarean section with a classic (vertical) incision on your uterus. Ask your health care provider what type of cesarean you had. Sometimes the scar on your skin is different than the scar on your uterus. °Even if you have had surgery on your uterus, certain types of ablation may still be safe for you. Talk with your health care provider. °LET YOUR HEALTH CARE PROVIDER KNOW ABOUT: °· Any allergies you have. °· All medicines you are taking, including vitamins, herbs, eye drops, creams, and over-the-counter medicines. °· Previous problems you or members of your family have had with the use of anesthetics. °· Any blood disorders you have. °· Previous surgeries you have had. °· Medical conditions you have. °RISKS AND  COMPLICATIONS  °Generally, this is a safe procedure. However, as with any procedure, complications can occur. Possible complications include: °· Perforation of the uterus. °· Bleeding. °· Infection of the uterus, bladder, or vagina. °· Injury to surrounding organs. °· An air bubble to the lung (air embolus). °· Pregnancy following the procedure. °· Failure of the procedure to help the problem, requiring hysterectomy. °· Decreased ability to diagnose cancer in the lining of the uterus. °BEFORE THE PROCEDURE °· The lining of the uterus must be tested to make sure there is no pre-cancerous or cancer cells present. °· An ultrasound may be performed to look at the size of the uterus and to check for abnormalities. °· Medicines may be given to thin the lining of the uterus. °PROCEDURE  °During the procedure, your health care provider will use a tool called a resectoscope to help see inside your uterus. There are different ways to remove the lining of your uterus.  °· Radiofrequency - This method uses a radiofrequency-alternating electric current to remove the lining of the uterus. °· Cryotherapy - This method uses extreme cold to freeze the lining of the uterus. °· Heated-Free Liquid - This method uses heated salt (saline) solution to remove the lining of the uterus. °· Microwave - This method uses high-energy microwaves to heat up the lining of the uterus to remove it. °· Thermal balloon - This method involves inserting a catheter with a balloon tip into the uterus. The balloon tip is filled with heated fluid to remove the lining of the uterus. °AFTER THE PROCEDURE  °After your procedure, do   not have sexual intercourse or insert anything into your vagina until permitted by your health care provider. After the procedure, you may experience: °· Cramps. °· Vaginal discharge. °· Frequent urination. °  °This information is not intended to replace advice given to you by your health care provider. Make sure you discuss any  questions you have with your health care provider. °  °Document Released: 02/28/2004 Document Revised: 01/09/2015 Document Reviewed: 09/21/2012 °Elsevier Interactive Patient Education ©2016 Elsevier Inc. ° °
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
## 2016-02-11 ENCOUNTER — Telehealth: Payer: Self-pay

## 2016-02-11 ENCOUNTER — Other Ambulatory Visit: Payer: Self-pay | Admitting: Internal Medicine

## 2016-02-11 NOTE — Telephone Encounter (Signed)
Left message for patient to call.

## 2016-02-12 MED ORDER — PROGESTERONE MICRONIZED 200 MG PO CAPS: ORAL_CAPSULE | ORAL | 0 refills | Status: DC

## 2016-02-12 NOTE — Telephone Encounter (Signed)
Patient returned my call.  Her ins covers Her Option at 100% because we are [facility] provider and she has [employer] ins.  Scheduled her for Friday Nov 3, 11:00am.  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 I schedule her for pre op appt the day prior at 3:30pm and explained that Dr. Moshe Salisbury would insert laminaria and give her written Rx's for her medications needed for surgery.  I advised she needs to take Prometrium daily until surgery and addt'l Rx sent.  I advised her regarding someone will need to drive her to and from surgery. Also , reviewed full bladder instructions and they will be mailed.  Patient had a few questions:  1.  She takes Mobic 15mg . Every day for her knee pain. She does not think she can get around without taking it. She asked is that going to be a problem. Will you recommend she discontinue it?  2.  How long after surgery until she can take a bath?  3.  How long after surgery until she can have sex?

## 2016-02-12 NOTE — Telephone Encounter (Signed)
I called patient back but her voice mail box was full and I could not leave a message.

## 2016-02-12 NOTE — Telephone Encounter (Signed)
She may continue her Mobic. No intercourse for 2 weeks. Shower Baths for first 2 weeks

## 2016-02-13 ENCOUNTER — Other Ambulatory Visit: Payer: Self-pay | Admitting: Gynecology

## 2016-02-13 DIAGNOSIS — N921 Excessive and frequent menstruation with irregular cycle: Secondary | ICD-10-CM

## 2016-02-13 NOTE — Telephone Encounter (Signed)
I  Called patient but voice mail box is full. I went ahead and sent her the answers to these questions on her instruction sheet for surgery. Mailed today.

## 2016-02-19 ENCOUNTER — Telehealth: Payer: Self-pay | Admitting: *Deleted

## 2016-02-19 ENCOUNTER — Other Ambulatory Visit: Payer: Self-pay

## 2016-02-19 NOTE — Telephone Encounter (Signed)
Pt called needs refill on progesterone 200 mg until her option on 03/06/16. Rx sent

## 2016-02-20 MED ORDER — PROGESTERONE MICRONIZED 200 MG PO CAPS: ORAL_CAPSULE | ORAL | 0 refills | Status: DC

## 2016-02-20 MED FILL — PROGESTERONE 200 MG CAPSULE: 200 | 13 days supply | Qty: 13 | Fill #0

## 2016-02-24 ENCOUNTER — Other Ambulatory Visit (INDEPENDENT_AMBULATORY_CARE_PROVIDER_SITE_OTHER): Payer: Self-pay | Admitting: Orthopaedic Surgery

## 2016-02-24 ENCOUNTER — Ambulatory Visit (INDEPENDENT_AMBULATORY_CARE_PROVIDER_SITE_OTHER): Payer: 59 | Admitting: Orthopaedic Surgery

## 2016-02-24 ENCOUNTER — Encounter (INDEPENDENT_AMBULATORY_CARE_PROVIDER_SITE_OTHER): Payer: Self-pay | Admitting: Orthopaedic Surgery

## 2016-02-24 VITALS — BP 102/60 | HR 68 | Resp 12 | Ht 67.0 in | Wt 172.0 lb

## 2016-02-24 DIAGNOSIS — M25649 Stiffness of unspecified hand, not elsewhere classified: Secondary | ICD-10-CM

## 2016-02-24 DIAGNOSIS — M79601 Pain in right arm: Secondary | ICD-10-CM

## 2016-02-24 LAB — CBC WITH DIFFERENTIAL/PLATELET
BASOS ABS: 50 {cells}/uL (ref 0–200)
Basophils Relative: 1 %
Eosinophils Absolute: 150 cells/uL (ref 15–500)
Eosinophils Relative: 3 %
HEMATOCRIT: 37.2 % (ref 35.0–45.0)
HEMOGLOBIN: 12.7 g/dL (ref 11.7–15.5)
LYMPHS ABS: 1550 {cells}/uL (ref 850–3900)
LYMPHS PCT: 31 %
MCH: 32 pg (ref 27.0–33.0)
MCHC: 34.1 g/dL (ref 32.0–36.0)
MCV: 93.7 fL (ref 80.0–100.0)
MONO ABS: 300 {cells}/uL (ref 200–950)
MPV: 8.8 fL (ref 7.5–12.5)
Monocytes Relative: 6 %
NEUTROS PCT: 59 %
Neutro Abs: 2950 cells/uL (ref 1500–7800)
Platelets: 197 10*3/uL (ref 140–400)
RBC: 3.97 MIL/uL (ref 3.80–5.10)
RDW: 13.2 % (ref 11.0–15.0)
WBC: 5 10*3/uL (ref 3.8–10.8)
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
## 2016-02-25 LAB — COMPLETE METABOLIC PANEL WITH GFR
ALBUMIN: 4.2 g/dL (ref 3.6–5.1)
ALK PHOS: 59 U/L (ref 33–115)
ALT: 6 U/L (ref 6–29)
AST: 10 U/L (ref 10–30)
BUN: 15 mg/dL (ref 7–25)
CALCIUM: 8.9 mg/dL (ref 8.6–10.2)
CO2: 24 mmol/L (ref 20–31)
CREATININE: 0.74 mg/dL (ref 0.50–1.10)
Chloride: 110 mmol/L (ref 98–110)
GFR, Est African American: >89 mL/min (ref >=60)
GFR, Est Non African American: >89 mL/min (ref >=60)
GLUCOSE: 100 mg/dL — AB (ref 65–99)
POTASSIUM: 3.8 mmol/L (ref 3.5–5.3)
SODIUM: 142 mmol/L (ref 135–146)
Total Bilirubin: 1.8 mg/dL — ABNORMAL HIGH (ref 0.2–1.2)
Total Protein: 6.1 g/dL (ref 6.1–8.1)

## 2016-02-25 LAB — ANA: ANA: POSITIVE — AB

## 2016-02-25 LAB — ANTI-NUCLEAR AB-TITER (ANA TITER): ANA Titer 1: 1:160 {titer} — ABNORMAL HIGH

## 2016-02-25 LAB — RHEUMATOID FACTOR: Rhuematoid fact SerPl-aCnc: <14 IU/mL (ref <14)

## 2016-02-25 LAB — SEDIMENTATION RATE: Sed Rate: 1 mm/hr (ref 0–20)

## 2016-02-25 LAB — CYCLIC CITRUL PEPTIDE ANTIBODY, IGG: Cyclic Citrullin Peptide Ab: <16 Units

## 2016-02-26 ENCOUNTER — Ambulatory Visit (INDEPENDENT_AMBULATORY_CARE_PROVIDER_SITE_OTHER): Payer: 59 | Admitting: Neurology

## 2016-02-26 ENCOUNTER — Encounter: Payer: Self-pay | Admitting: Neurology

## 2016-02-26 VITALS — BP 120/70 | HR 98 | Ht 67.0 in | Wt 178.6 lb

## 2016-02-26 DIAGNOSIS — G43009 Migraine without aura, not intractable, without status migrainosus: Secondary | ICD-10-CM | POA: Diagnosis not present

## 2016-02-26 MED ORDER — TOPIRAMATE 100 MG PO TABS: 100.0000 mg | ORAL_TABLET | Freq: Every day | ORAL | 3 refills | Status: DC

## 2016-02-26 MED FILL — TOPIRAMATE 100 MG TABLET: 100 | 30 days supply | Qty: 30 | Fill #0 | Status: TO

## 2016-02-26 NOTE — Progress Notes (Signed)
Chart forwarded.  

## 2016-02-26 NOTE — Progress Notes (Signed)
Records faxed to [facility] and [place] @ [phone] (Ph: [phone] - [address] [place] [address])

## 2016-02-26 NOTE — Patient Instructions (Signed)
1.  We will increase topiramate to 100mg  at bedtime 2.  Continue rizatriptan with Zofran for acute migraines (limit to no more than 2 days out of the week) 3.  Send over FMLA paperwork 4.  Follow up in 3 months.
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
## 2016-02-27 ENCOUNTER — Encounter: Payer: Self-pay | Admitting: Neurology

## 2016-02-27 ENCOUNTER — Telehealth: Payer: Self-pay | Admitting: Neurology

## 2016-02-27 DIAGNOSIS — Z029 Encounter for administrative examinations, unspecified: Secondary | ICD-10-CM

## 2016-02-27 NOTE — Telephone Encounter (Signed)
Form updated by changing start date to 04/30/15 (instead of '17) and adding the start date of 02/27/16 and end date of 02/26/17 for a year authorization. Refaxed to company.

## 2016-02-27 NOTE — Telephone Encounter (Signed)
Renee Williams 25-Aug-1970. She called in this morning regarding some paperwork that Dr.Jaffe had filled out for her. She said she needed the part that was left blank to say  (1 Year) and the Date to start to say 10/26 instead of 12/26? She also said intermittently? She asked if those changes could be made and re faxed. Thank you

## 2016-03-05 ENCOUNTER — Ambulatory Visit (INDEPENDENT_AMBULATORY_CARE_PROVIDER_SITE_OTHER): Payer: 59 | Admitting: Gynecology

## 2016-03-05 ENCOUNTER — Encounter: Payer: Self-pay | Admitting: Gynecology

## 2016-03-05 VITALS — BP 128/78

## 2016-03-05 DIAGNOSIS — N920 Excessive and frequent menstruation with regular cycle: Secondary | ICD-10-CM

## 2016-03-05 MED FILL — miSOPROStol 200 MCG TABS: 200 | 1 days supply | Qty: 1 | Fill #0

## 2016-03-05 MED FILL — OXYCODONE W/APAP 5/325 TAB: 5-325 | 3 days supply | Qty: 10 | Fill #0

## 2016-03-05 MED FILL — AZITHROMYCIN 500 MG TABLET: 500 | 2 days supply | Qty: 2 | Fill #0
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
## 2016-03-06 ENCOUNTER — Ambulatory Visit (INDEPENDENT_AMBULATORY_CARE_PROVIDER_SITE_OTHER): Payer: 59

## 2016-03-06 ENCOUNTER — Ambulatory Visit: Payer: 59 | Admitting: Gynecology

## 2016-03-06 ENCOUNTER — Ambulatory Visit (INDEPENDENT_AMBULATORY_CARE_PROVIDER_SITE_OTHER): Payer: 59 | Admitting: Gynecology

## 2016-03-06 ENCOUNTER — Encounter: Payer: Self-pay | Admitting: Gynecology

## 2016-03-06 ENCOUNTER — Other Ambulatory Visit: Payer: 59

## 2016-03-06 VITALS — BP 104/70 | HR 68

## 2016-03-06 DIAGNOSIS — R102 Pelvic and perineal pain: Secondary | ICD-10-CM | POA: Diagnosis not present

## 2016-03-06 DIAGNOSIS — N921 Excessive and frequent menstruation with irregular cycle: Secondary | ICD-10-CM | POA: Diagnosis not present

## 2016-03-06 DIAGNOSIS — N920 Excessive and frequent menstruation with regular cycle: Secondary | ICD-10-CM

## 2016-03-06 MED ORDER — LIDOCAINE HCL 1 % IJ SOLN
10.0000 mL | Freq: Once | INTRAMUSCULAR | Status: AC
  Administered 2016-03-06: 10 mL

## 2016-03-06 MED ORDER — KETOROLAC TROMETHAMINE 30 MG/ML IJ SOLN
60.0000 mg | Freq: Once | INTRAMUSCULAR | Status: AC
  Administered 2016-03-06: 60 mg via INTRAMUSCULAR
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
## 2016-03-10 ENCOUNTER — Encounter: Payer: Self-pay | Admitting: Anesthesiology

## 2016-03-18 DIAGNOSIS — F32A Depression, unspecified: Secondary | ICD-10-CM | POA: Insufficient documentation

## 2016-03-18 DIAGNOSIS — F329 Major depressive disorder, single episode, unspecified: Secondary | ICD-10-CM | POA: Insufficient documentation

## 2016-03-18 DIAGNOSIS — F419 Anxiety disorder, unspecified: Secondary | ICD-10-CM | POA: Insufficient documentation

## 2016-03-18 DIAGNOSIS — D509 Iron deficiency anemia, unspecified: Secondary | ICD-10-CM | POA: Insufficient documentation

## 2016-03-18 DIAGNOSIS — K8020 Calculus of gallbladder without cholecystitis without obstruction: Secondary | ICD-10-CM | POA: Insufficient documentation

## 2016-03-18 DIAGNOSIS — M79642 Pain in left hand: Principal | ICD-10-CM

## 2016-03-18 DIAGNOSIS — M79641 Pain in right hand: Secondary | ICD-10-CM | POA: Insufficient documentation
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
## 2016-03-20 ENCOUNTER — Ambulatory Visit (INDEPENDENT_AMBULATORY_CARE_PROVIDER_SITE_OTHER): Payer: 59 | Admitting: Gynecology

## 2016-03-20 VITALS — BP 126/84

## 2016-03-20 DIAGNOSIS — Z09 Encounter for follow-up examination after completed treatment for conditions other than malignant neoplasm: Secondary | ICD-10-CM

## 2016-03-20 NOTE — Progress Notes (Signed)
   Patient is a 45 year old who a [facility] 2 weeks ago had an endometrial ablation via the Her Option cryo-ablation technique. She has done well from her surgery. This was done as a result of her menorrhagia.  Exam: Abdomen: Soft nontender no rebound or guarding Pelvic: Bartholin urethra Skene was within normal limits Vagina: No lesions or discharge Cervix: No lesions or discharge Uterus: Anteverted normal size shape and consistency Adnexa: No palpable masses or tenderness Rectal exam not done  Assessment/plan: Patient weeks status post endometrial ablation the office doing well. Patient was provided with a requisition to schedule her overdue baseline mammogram. Patient otherwise scheduled to return to the office in one year for her annual exam. Her flu vaccine is up-to-date.

## 2016-03-24 ENCOUNTER — Ambulatory Visit (INDEPENDENT_AMBULATORY_CARE_PROVIDER_SITE_OTHER): Payer: Self-pay

## 2016-03-24 ENCOUNTER — Ambulatory Visit (INDEPENDENT_AMBULATORY_CARE_PROVIDER_SITE_OTHER): Payer: 59 | Admitting: Rheumatology

## 2016-03-24 ENCOUNTER — Encounter: Payer: Self-pay | Admitting: Rheumatology

## 2016-03-24 VITALS — BP 124/78 | HR 86 | Resp 13 | Ht 67.0 in | Wt 184.0 lb

## 2016-03-24 DIAGNOSIS — F419 Anxiety disorder, unspecified: Secondary | ICD-10-CM | POA: Diagnosis not present

## 2016-03-24 DIAGNOSIS — R5383 Other fatigue: Secondary | ICD-10-CM | POA: Diagnosis not present

## 2016-03-24 DIAGNOSIS — F329 Major depressive disorder, single episode, unspecified: Secondary | ICD-10-CM

## 2016-03-24 DIAGNOSIS — Z6828 Body mass index (BMI) 28.0-28.9, adult: Secondary | ICD-10-CM | POA: Diagnosis not present

## 2016-03-24 DIAGNOSIS — M25561 Pain in right knee: Secondary | ICD-10-CM

## 2016-03-24 DIAGNOSIS — M79671 Pain in right foot: Secondary | ICD-10-CM

## 2016-03-24 DIAGNOSIS — M25562 Pain in left knee: Secondary | ICD-10-CM

## 2016-03-24 DIAGNOSIS — M79642 Pain in left hand: Secondary | ICD-10-CM

## 2016-03-24 DIAGNOSIS — L409 Psoriasis, unspecified: Secondary | ICD-10-CM | POA: Diagnosis not present

## 2016-03-24 DIAGNOSIS — K8080 Other cholelithiasis without obstruction: Secondary | ICD-10-CM | POA: Diagnosis not present

## 2016-03-24 DIAGNOSIS — M79672 Pain in left foot: Secondary | ICD-10-CM | POA: Diagnosis not present

## 2016-03-24 DIAGNOSIS — D509 Iron deficiency anemia, unspecified: Secondary | ICD-10-CM

## 2016-03-24 DIAGNOSIS — F32A Depression, unspecified: Secondary | ICD-10-CM

## 2016-03-24 DIAGNOSIS — M79641 Pain in right hand: Secondary | ICD-10-CM

## 2016-03-24 DIAGNOSIS — G8929 Other chronic pain: Secondary | ICD-10-CM

## 2016-03-24 LAB — TSH: TSH: 2.24 m[IU]/L

## 2016-03-24 LAB — CK: Total CK: 37 U/L (ref 7–177)

## 2016-03-25 LAB — HEPATITIS PANEL, ACUTE
HCV AB: NEGATIVE
HEP A IGM: NONREACTIVE
HEP B S AG: NEGATIVE
Hep B C IgM: NONREACTIVE

## 2016-03-25 LAB — IGG, IGA, IGM
IGA: 146 mg/dL (ref 81–463)
IGG (IMMUNOGLOBIN G), SERUM: 687 mg/dL — AB (ref 694–1618)
IGM, SERUM: 63 mg/dL (ref 48–271)

## 2016-03-26 LAB — QUANTIFERON TB GOLD ASSAY (BLOOD)
INTERFERON GAMMA RELEASE ASSAY: NEGATIVE
MITOGEN-NIL SO: 4.85 [IU]/mL
QUANTIFERON NIL VALUE: 0.01 [IU]/mL
Quantiferon Tb Ag Minus Nil Value: 0 IU/mL

## 2016-03-27 LAB — PROTEIN ELECTROPHORESIS, SERUM
ALBUMIN ELP: 4.5 g/dL (ref 3.8–4.8)
ALPHA-1-GLOBULIN: 0.4 g/dL — AB (ref 0.2–0.3)
Alpha-2-Globulin: 0.6 g/dL (ref 0.5–0.9)
BETA 2: 0.3 g/dL (ref 0.2–0.5)
Beta Globulin: 0.4 g/dL (ref 0.4–0.6)
Gamma Globulin: 0.7 g/dL — ABNORMAL LOW (ref 0.8–1.7)
TOTAL PROTEIN, SERUM ELECTROPHOR: 6.8 g/dL (ref 6.1–8.1)

## 2016-04-03 LAB — HLA-B27 ANTIGEN: DNA Result:: NEGATIVE

## 2016-04-03 NOTE — Progress Notes (Signed)
Will discuss at fu

## 2016-04-07 ENCOUNTER — Other Ambulatory Visit: Payer: Self-pay | Admitting: Rheumatology

## 2016-04-07 NOTE — Telephone Encounter (Signed)
New patient visit: 03/24/16  New Patient follow up: 05/05/16 CBC and CMP 02/2016 WNL  Patient has only been seen once. Will you take over prescribing her Mobic?

## 2016-04-07 NOTE — Telephone Encounter (Signed)
Patient would like a refill of Mobic sent to [facility] outpatient pharmacy.

## 2016-04-07 NOTE — Telephone Encounter (Signed)
Ok to refill till fu visit.

## 2016-04-08 MED ORDER — MELOXICAM 15 MG PO TABS: 15.0000 mg | ORAL_TABLET | Freq: Every day | ORAL | 0 refills | Status: DC

## 2016-04-17 ENCOUNTER — Telehealth: Payer: Self-pay | Admitting: *Deleted

## 2016-04-17 NOTE — Telephone Encounter (Signed)
Pt called and left message in triage voicemail c/o bleeding,I called pt back and received her voicemail I asked pt to call back.

## 2016-04-21 NOTE — Progress Notes (Signed)
Patient is scheduled to have ultrasound examination of bilateral hands today. She has history of bilateral hand discomfort. She had clinical features of osteoarthritis and x-rays were consistent with osteoarthritis as well. She has nail pitting and history of psoriasis as a child and she had psoriasis in her scalp during the last visit. Her labs done last visit were unremarkable. HLA-B27 was negative.  Ultrasound examination of bilateral hands today was consistent with osteoarthritis please see attached imaging note. Bo Merino, MD

## 2016-04-22 ENCOUNTER — Inpatient Hospital Stay (INDEPENDENT_AMBULATORY_CARE_PROVIDER_SITE_OTHER): Payer: Self-pay

## 2016-04-22 ENCOUNTER — Ambulatory Visit (INDEPENDENT_AMBULATORY_CARE_PROVIDER_SITE_OTHER): Payer: 59 | Admitting: Rheumatology

## 2016-04-22 ENCOUNTER — Encounter: Payer: Self-pay | Admitting: Rheumatology

## 2016-04-22 DIAGNOSIS — M79642 Pain in left hand: Secondary | ICD-10-CM

## 2016-04-22 DIAGNOSIS — M79641 Pain in right hand: Secondary | ICD-10-CM | POA: Diagnosis not present

## 2016-04-22 MED FILL — RIZATRIPTAN 10 MG ODT: 10 | 30 days supply | Qty: 9 | Fill #2

## 2016-04-22 NOTE — Patient Instructions (Signed)
Supplements for OA Natural anti-inflammatories  You can purchase these at [employer], [employer] or online.  . Turmeric (capsules)  . Ginger (ginger root or capsules)  . Omega 3 (Fish, flax seeds, chia seeds, walnuts, almonds)  . Tart cherry (dried or extract)   Patient should be under the care of a physician while taking these supplements. This may not be reproduced without the permission of Dr. Nianna Igo.  

## 2016-04-30 DIAGNOSIS — L409 Psoriasis, unspecified: Secondary | ICD-10-CM | POA: Insufficient documentation

## 2016-04-30 DIAGNOSIS — M25562 Pain in left knee: Secondary | ICD-10-CM

## 2016-04-30 DIAGNOSIS — R5383 Other fatigue: Secondary | ICD-10-CM | POA: Insufficient documentation

## 2016-04-30 DIAGNOSIS — M79672 Pain in left foot: Secondary | ICD-10-CM

## 2016-04-30 DIAGNOSIS — M79671 Pain in right foot: Secondary | ICD-10-CM | POA: Insufficient documentation

## 2016-04-30 DIAGNOSIS — G8929 Other chronic pain: Secondary | ICD-10-CM | POA: Insufficient documentation

## 2016-04-30 DIAGNOSIS — M25561 Pain in right knee: Secondary | ICD-10-CM

## 2016-04-30 NOTE — Progress Notes (Signed)
Office Visit Note  Patient: Renee Williams             Date of Birth: [birth year]           MRN: [id number]             PCP: Briant Sites, PA-C Referring: Briant Sites* Visit Date: 05/05/2016 Occupation: LPN , night shift    Subjective:  Hand pain   History of Present Illness: Renee Williams is a 45 y.o. female with history of psoriasis and osteoarthritis. She states she's been experiencing increased pain since the weather has become cold. The pain moves around. She complains of pain in her right arm and bilateral hands. She denies any joint swelling.   Activities of Daily Living:  Patient reports morning stiffness for 5 minutes.   Patient Denies nocturnal pain.  Difficulty dressing/grooming: Denies Difficulty climbing stairs: Denies Difficulty getting out of chair: Denies Difficulty using hands for taps, buttons, cutlery, and/or writing: Denies   Review of Systems  Constitutional: Positive for fatigue and weight gain. Negative for night sweats, weight loss and weakness.  HENT: Negative for mouth sores, trouble swallowing, trouble swallowing, mouth dryness and nose dryness.   Eyes: Negative for pain, redness, visual disturbance and dryness.  Respiratory: Negative for cough, shortness of breath and difficulty breathing.   Cardiovascular: Negative for chest pain, palpitations, hypertension, irregular heartbeat and swelling in legs/feet.  Gastrointestinal: Negative for blood in stool, constipation and diarrhea.  Endocrine: Negative for increased urination.  Genitourinary: Negative for vaginal dryness.  Musculoskeletal: Positive for arthralgias, joint pain and morning stiffness. Negative for joint swelling, myalgias, muscle weakness, muscle tenderness and myalgias.  Skin: Positive for rash. Negative for color change, hair loss, skin tightness, ulcers and sensitivity to sunlight.  Allergic/Immunologic: Negative for susceptible to infections.  Neurological: Negative  for dizziness, memory loss and night sweats.  Hematological: Negative for swollen glands.  Psychiatric/Behavioral: Positive for depressed mood and sleep disturbance. The patient is nervous/anxious.     PMFS History:  Patient Active Problem List   Diagnosis Date Noted  . Primary osteoarthritis of both hands 05/03/2016  . Primary osteoarthritis of both feet 05/03/2016  . Primary osteoarthritis of both knees 05/03/2016  . Psoriasis 04/30/2016  . Chronic pain of both knees 04/30/2016  . Pain in both feet 04/30/2016  . Other fatigue 04/30/2016  . Pain in both hands 03/18/2016  . Anxiety 03/18/2016  . Depression 03/18/2016  . Cholelithiasis 03/18/2016  . Iron deficiency anemia 03/18/2016  . Menorrhagia with regular cycle 03/06/2016  . Arm pain, posterior, right 02/24/2016  . BMI 28.0-28.9,adult 04/13/2015    Past Medical History:  Diagnosis Date  . Gallstones   . Gestational diabetes    Diet controlled  . HELLP syndrome 2009  . Migraines   . Pregnancy induced hypertension   . Psoriasis     Family History  Problem Relation Age of Onset  . Diabetes Maternal Aunt   . Stroke Maternal Aunt   . Diabetes Maternal Uncle   . Cancer Maternal Grandfather     Lung  . Osteoarthritis Maternal Grandfather   . Diabetes Paternal Grandmother   . Osteoarthritis Paternal Grandmother   . Diabetes Maternal Grandmother   . Hypertension Maternal Grandmother   . Osteoarthritis Maternal Grandmother   . Osteoarthritis Paternal Grandfather    Past Surgical History:  Procedure Laterality Date  . CESAREAN SECTION    . CESAREAN SECTION WITH BILATERAL TUBAL LIGATION N/A 09/21/2012   Procedure: CESAREAN SECTION WITH  BILATERAL TUBAL LIGATION;  Surgeon: James G Arnold, MD;  Location: [facility];  Service: Obstetrics;  Laterality: N/A;  . CRYOABLATION    . tubal ablation    . WISDOM TOOTH EXTRACTION     Social History   Social History Narrative  . No narrative on file     Objective: Vital Signs:  BP 107/76 (BP Location: Left Arm, Patient Position: Sitting)   Resp 14   Ht 5' 7" (1.702 m)   Wt 188 lb (85.3 kg)   BMI 29.44 kg/m    Physical Exam  Constitutional: She is oriented to person, place, and time. She appears well-developed and well-nourished.  HENT:  Head: Normocephalic and atraumatic.  Eyes: Conjunctivae and EOM are normal.  Neck: Normal range of motion.  Cardiovascular: Normal rate, regular rhythm, normal heart sounds and intact distal pulses.   Pulmonary/Chest: Effort normal and breath sounds normal.  Abdominal: Soft. Bowel sounds are normal.  Lymphadenopathy:    She has no cervical adenopathy.  Neurological: She is alert and oriented to person, place, and time.  Skin: Skin is warm and dry. Capillary refill takes less than 2 seconds.  Lipoma palpable over right arm  Psychiatric: She has a normal mood and affect. Her behavior is normal.  Nursing note and vitals reviewed.    Musculoskeletal Exam: C-spine, thoracic, lumbar spine good range of motion. No SI joint tenderness. Shoulder joints elbow joints wrist joint MCPs PIPs DIPs with good range of motion she has DIP thickening in her bilateral hands with no synovitis hip joints knee joints ankles MTPs PIPs DIPs with good range of motion with no synovitis.  CDAI Exam: No CDAI exam completed.    Investigation: No additional findings. Office Visit on 03/24/2016  Component Date Value Ref Range Status  . Total CK 03/24/2016 37  7 - 177 U/L Final  . TSH 03/24/2016 2.24  mIU/L Final   Comment:   Reference Range   > or = 20 Years  0.40-4.50   Pregnancy Range First trimester  0.26-2.66 Second trimester 0.55-2.73 Third trimester  0.43-2.91     . Total Protein, Serum Electrophores* 03/27/2016 6.8  6.1 - 8.1 g/dL Final  . Albumin ELP 03/27/2016 4.5  3.8 - 4.8 g/dL Final  . Alpha-1-Globulin 03/27/2016 0.4* 0.2 - 0.3 g/dL Final  . Alpha-2-Globulin 03/27/2016 0.6  0.5 - 0.9 g/dL Final  . Beta Globulin 03/27/2016 0.4   0.4 - 0.6 g/dL Final  . Beta 2 03/27/2016 0.3  0.2 - 0.5 g/dL Final  . Gamma Globulin 03/27/2016 0.7* 0.8 - 1.7 g/dL Final  . Abnormal Protein Band1 03/27/2016 NOT DET  g/dL Final  . SPE Interp. 03/27/2016 SEE NOTE   Final   Comment: One or more serum protein fractions are outside the normal ranges. No abnormal protein bands are apparent. Reviewed by Janice J. Hessling, MD, PhD, FCAP (Electronic Signature on File)   . Abnormal Protein Band2 03/27/2016 NOT DET  g/dL Final  . Abnormal Protein Band3 03/27/2016 NOT DET  g/dL Final  . IgG (Immunoglobin G), Serum 03/25/2016 687* 694 - 1,618 mg/dL Final  . IgA 03/25/2016 146  81 - 463 mg/dL Final  . IgM, Serum 03/25/2016 63  48 - 271 mg/dL Final  . Hepatitis B Surface Ag 03/25/2016 NEGATIVE  NEGATIVE Final  . HCV Ab 03/25/2016 NEGATIVE  NEGATIVE Final  . Hep B C IgM 03/25/2016 NON REACTIVE  NON REACTIVE Final   Comment: High levels of Hepatitis B Core IgM antibody are detectable   during the acute stage of Hepatitis B. This antibody is used to differentiate current from past HBV infection.     . Hep A IgM 03/25/2016 NON REACTIVE  NON REACTIVE Final   Comment:   Effective March 19, 2014, Hepatitis Acute Panel (test code [phone]) will be revised to automatically reflex to the Hepatitis C Viral RNA, Quantitative, Real-Time PCR assay if the Hepatitis C antibody screening result is Reactive. This action is being taken to ensure that the CDC/USPSTF recommended HCV diagnostic algorithm with the appropriate test reflex needed for accurate interpretation is followed.     . Interferon Gamma Release Assay 03/26/2016 NEGATIVE  NEGATIVE Final  . Quantiferon Nil Value 03/26/2016 0.01  IU/mL Final  . Mitogen-Nil 03/26/2016 4.85  IU/mL Final  . Quantiferon Tb Ag Minus Nil Value 03/26/2016 0.00  IU/mL Final   Comment:   The Nil tube value is used to determine if the patient has a preexisting immune response which could cause a false-positive  reading on the test. In order for a test to be valid, the Nil tube must have a value of less than or equal to 8.0 IU/mL.   The mitogen control tube is used to assure the patient has a healthy immune status and also serves as a control for correct blood handling and incubation. It is used to detect false-negative readings. The mitogen tube must have a gamma interferon value of greater than or equal to 0.5 IU/mL higher than the value of the Nil tube.   The TB antigen tube is coated with the M. tuberculosis specific antigens. For a test to be considered positive, the TB antigen tube value minus the Nil tube value must be greater than or equal to 0.35 IU/mL.   For additional information, please refer to http://education.questdiagnostics.com/faq/QFT (This link is being provided for informational/educational purposes only.)   . DNA Result: 04/03/2016 Negative  Negative Final  . Results reviewed by: 04/03/2016 REPORT   Final   Comment: Ileene Hutchinson, Ph.D.,[facility] Senior Director, Molecular Genetics The B27 allele group of the HLA-B locus is present in 2 to 9% of the general population.  About 20% of HLA-B27 carriers develop autoimmune disorders including Ankylosing Spondylitis (AS), Reactive Arthritis, Psoriatic Arthritis, Undifferentiated Oligoarthritis, Uveitis, or Inflammatory Bowel Disease. The highest association is with AS, where approximately 95% of AS patients are HLA-B27 positive. Genetic counseling as needed. Typing performed by PCR and hybridization with sequence specific oligonucleotide probes (SSO) using the FDA-cleared LABType(R) SSO Kit.     Imaging: [place] Extrem Up Bilat Comp  Result Date: 04/22/2016 Ultrasound examination of bilateral hands was performed per EULAR recommendations. Using 12 MHz transducer, grayscale and power Doppler bilateral second MCP joints, PIPs and DIPs of second third and fifth fingers and bilateral wrist joints both dorsal and volar  aspects were evaluated to look for synovitis or tenosynovitis. The findings were there was no synovitis or tenosynovitis on ultrasound examination. DIP PIP of all examined joints was noted without any erosions. Right median nerve was 0.10 cm squares which was within normal limits and left median nerve was 0.09 cm squares which was within normal limits. Impression: Ultrasound examination of bilateral hands was consistent with osteoarthritis. There was no evidence of inflammatory arthritis   Speciality Comments: No specialty comments available.    Procedures:  No procedures performed Allergies: Patient has no known allergies.   Assessment / Plan:     Visit Diagnoses: Psoriasis - History of psoriasis since childhood and nail pitting. She states she  still have a few lesions in her scalp.  Other fatigue: She works night shift and always have some fatigue.  Primary osteoarthritis of both hands - Ultrasound negative for synovitis. She is taking natural supplements for right now. She will take Mobic on when necessary basis only.  Primary osteoarthritis of both feet: Proper fitting shoes were discussed.  Primary osteoarthritis of both knees - Bilateral moderate with chondromalacia patella: Weight loss diet and exercise was discussed.  BMI 28.0-28.9,adult  She has following medical problems:  Anxiety  Depression, unspecified depression type  Biliary calculus of other site without obstruction  Iron deficiency anemia, unspecified iron deficiency anemia type  Positive ANA (antinuclear antibody) - 1:160, ENA negative . No clinical features of autoimmune disease.     Orders: No orders of the defined types were placed in this encounter.  No orders of the defined types were placed in this encounter.   Face-to-face time spent with patient was 30 minutes. 50% of time was spent in counseling and coordination of care.  Follow-Up Instructions: Return in about 6 months (around 11/02/2016) for  Osteoarthritis.   Bo Merino, MD

## 2016-05-03 DIAGNOSIS — M19071 Primary osteoarthritis, right ankle and foot: Secondary | ICD-10-CM | POA: Insufficient documentation

## 2016-05-03 DIAGNOSIS — M170 Bilateral primary osteoarthritis of knee: Secondary | ICD-10-CM | POA: Insufficient documentation

## 2016-05-03 DIAGNOSIS — M19042 Primary osteoarthritis, left hand: Secondary | ICD-10-CM

## 2016-05-03 DIAGNOSIS — M19072 Primary osteoarthritis, left ankle and foot: Secondary | ICD-10-CM

## 2016-05-03 DIAGNOSIS — M19041 Primary osteoarthritis, right hand: Secondary | ICD-10-CM | POA: Insufficient documentation

## 2016-05-05 ENCOUNTER — Encounter: Payer: Self-pay | Admitting: Rheumatology

## 2016-05-05 ENCOUNTER — Ambulatory Visit (INDEPENDENT_AMBULATORY_CARE_PROVIDER_SITE_OTHER): Payer: 59 | Admitting: Rheumatology

## 2016-05-05 VITALS — BP 107/76 | Resp 14 | Ht 67.0 in | Wt 188.0 lb

## 2016-05-05 DIAGNOSIS — M19072 Primary osteoarthritis, left ankle and foot: Secondary | ICD-10-CM

## 2016-05-05 DIAGNOSIS — R768 Other specified abnormal immunological findings in serum: Secondary | ICD-10-CM

## 2016-05-05 DIAGNOSIS — M19041 Primary osteoarthritis, right hand: Secondary | ICD-10-CM

## 2016-05-05 DIAGNOSIS — M170 Bilateral primary osteoarthritis of knee: Secondary | ICD-10-CM

## 2016-05-05 DIAGNOSIS — M19042 Primary osteoarthritis, left hand: Secondary | ICD-10-CM

## 2016-05-05 DIAGNOSIS — K8080 Other cholelithiasis without obstruction: Secondary | ICD-10-CM

## 2016-05-05 DIAGNOSIS — M19071 Primary osteoarthritis, right ankle and foot: Secondary | ICD-10-CM | POA: Diagnosis not present

## 2016-05-05 DIAGNOSIS — F329 Major depressive disorder, single episode, unspecified: Secondary | ICD-10-CM | POA: Diagnosis not present

## 2016-05-05 DIAGNOSIS — R5383 Other fatigue: Secondary | ICD-10-CM

## 2016-05-05 DIAGNOSIS — L409 Psoriasis, unspecified: Secondary | ICD-10-CM | POA: Diagnosis not present

## 2016-05-05 DIAGNOSIS — F419 Anxiety disorder, unspecified: Secondary | ICD-10-CM

## 2016-05-05 DIAGNOSIS — Z6828 Body mass index (BMI) 28.0-28.9, adult: Secondary | ICD-10-CM

## 2016-05-05 DIAGNOSIS — F32A Depression, unspecified: Secondary | ICD-10-CM

## 2016-05-05 DIAGNOSIS — D509 Iron deficiency anemia, unspecified: Secondary | ICD-10-CM

## 2016-05-14 NOTE — Progress Notes (Signed)
Sent Copy to PCP

## 2016-05-28 MED FILL — BUPROPION HCL XL 150 MG TAB: 150 | 30 days supply | Qty: 30 | Fill #2

## 2016-06-09 DIAGNOSIS — R6889 Other general symptoms and signs: Secondary | ICD-10-CM | POA: Diagnosis not present

## 2016-06-09 DIAGNOSIS — R509 Fever, unspecified: Secondary | ICD-10-CM | POA: Diagnosis not present

## 2016-06-15 ENCOUNTER — Telehealth: Payer: Self-pay | Admitting: Neurology

## 2016-06-15 ENCOUNTER — Other Ambulatory Visit: Payer: Self-pay | Admitting: Neurology

## 2016-06-15 MED FILL — TOPIRAMATE 100 MG TABLET: 100 | 30 days supply | Qty: 30 | Fill #0 | Status: TO

## 2016-06-15 MED FILL — RIZATRIPTAN 10 MG ODT: 10 | 30 days supply | Qty: 9 | Fill #3

## 2016-06-15 NOTE — Telephone Encounter (Signed)
Returned call. No answer. Topamax Rx was sent to pharmacy on file earlier this morning through Rx refill request.

## 2016-06-15 NOTE — Telephone Encounter (Signed)
Patient returned your call I read what you typed and she said thank you

## 2016-06-15 NOTE — Telephone Encounter (Signed)
PT called in regards to her topamax prescription and said she is out but she is transferring pharmacies/[facility] CB# [phone]

## 2016-06-19 ENCOUNTER — Encounter: Payer: Self-pay | Admitting: Neurology

## 2016-06-19 ENCOUNTER — Ambulatory Visit (INDEPENDENT_AMBULATORY_CARE_PROVIDER_SITE_OTHER): Payer: 59 | Admitting: Neurology

## 2016-06-19 VITALS — BP 108/62 | HR 89 | Wt 182.0 lb

## 2016-06-19 DIAGNOSIS — G43009 Migraine without aura, not intractable, without status migrainosus: Secondary | ICD-10-CM | POA: Diagnosis not present

## 2016-06-19 MED ORDER — TOPIRAMATE 100 MG PO TABS: 150.0000 mg | ORAL_TABLET | Freq: Every day | ORAL | 3 refills | Status: DC
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
## 2016-06-19 NOTE — Patient Instructions (Signed)
Migraine Recommendations: 1.  Increase topiramate 100mg  to 1 and 1/2 tablets daily (total of 150mg  daily).  Call in 6 weeks with update and we can adjust dose if needed. 2.  Take Maxalt and Zofran as needed. 3.  Limit use of pain relievers to no more than 2 days out of the week.  These medications include acetaminophen, ibuprofen, triptans and narcotics.  This will help reduce risk of rebound headaches. 4.  Be aware of common food triggers such as processed sweets, processed foods with nitrites (such as deli meat, hot dogs, sausages), foods with MSG, alcohol (such as wine), chocolate, certain cheeses, certain fruits (dried fruits, some citrus fruit), vinegar, diet soda. 4.  Avoid caffeine 5.  Routine exercise 6.  Proper sleep hygiene 7.  Stay adequately hydrated with water 8.  Keep a headache diary. 9.  Maintain proper stress management. 10.  Do not skip meals. 11.  Consider supplements:  Magnesium citrate 400mg  to 600mg  daily, riboflavin 400mg , Coenzyme Q 10 100mg  three times daily 12.  Follow up in 4 months.

## 2016-06-22 MED FILL — BUPROPION HCL XL 150 MG TAB: 150 | 90 days supply | Qty: 90 | Fill #0 | Status: TO

## 2016-07-03 MED FILL — TOPIRAMATE 100 MG TABLET: 100 | 30 days supply | Qty: 45 | Fill #0

## 2016-08-10 ENCOUNTER — Telehealth: Payer: Self-pay | Admitting: Neurology

## 2016-08-10 MED ORDER — TOPIRAMATE 100 MG PO TABS: 150.0000 mg | ORAL_TABLET | Freq: Every day | ORAL | 1 refills | Status: DC

## 2016-08-10 MED FILL — TOPIRAMATE 100 MG TABLET: 100 | 90 days supply | Qty: 135 | Fill #0

## 2016-08-10 NOTE — Telephone Encounter (Signed)
Returned call. No answer. Vmail full

## 2016-08-10 NOTE — Telephone Encounter (Signed)
Patient wants to know if she can get a 90 day supply of the topamax sent to [facility]

## 2016-08-11 NOTE — Telephone Encounter (Signed)
Patient returned call yesterday evening. Advised medication sent.

## 2016-09-16 ENCOUNTER — Encounter: Payer: Self-pay | Admitting: Gynecology

## 2016-10-19 ENCOUNTER — Ambulatory Visit: Payer: 59 | Admitting: Neurology

## 2016-11-10 NOTE — Progress Notes (Deleted)
Office Visit Note  Patient: Renee Williams             Date of Birth: 1971-04-14           MRN: [id number]             PCP: Briant Sites, PA-C Referring: Briant Sites* Visit Date: 11/12/2016 Occupation: @GUAROCC @    Subjective:  No chief complaint on file.   History of Present Illness: Renee Williams is a 46 y.o. female ***   Activities of Daily Living:  Patient reports morning stiffness for *** {minute/hour:19697}.   Patient {ACTIONS;DENIES/REPORTS:21021675::"Denies"} nocturnal pain.  Difficulty dressing/grooming: {ACTIONS;DENIES/REPORTS:21021675::"Denies"} Difficulty climbing stairs: {ACTIONS;DENIES/REPORTS:21021675::"Denies"} Difficulty getting out of chair: {ACTIONS;DENIES/REPORTS:21021675::"Denies"} Difficulty using hands for taps, buttons, cutlery, and/or writing: {ACTIONS;DENIES/REPORTS:21021675::"Denies"}   No Rheumatology ROS completed.   PMFS History:  Patient Active Problem List   Diagnosis Date Noted  . Primary osteoarthritis of both hands 05/03/2016  . Primary osteoarthritis of both feet 05/03/2016  . Primary osteoarthritis of both knees 05/03/2016  . Psoriasis 04/30/2016  . Chronic pain of both knees 04/30/2016  . Pain in both feet 04/30/2016  . Other fatigue 04/30/2016  . Pain in both hands 03/18/2016  . Anxiety 03/18/2016  . Depression 03/18/2016  . Cholelithiasis 03/18/2016  . Iron deficiency anemia 03/18/2016  . Menorrhagia with regular cycle 03/06/2016  . Arm pain, posterior, right 02/24/2016  . BMI 28.0-28.9,adult 04/13/2015    Past Medical History:  Diagnosis Date  . Gallstones   . Gestational diabetes    Diet controlled  . HELLP syndrome 2009  . Migraines   . Pregnancy induced hypertension   . Psoriasis     Family History  Problem Relation Age of Onset  . Diabetes Maternal Aunt   . Stroke Maternal Aunt   . Diabetes Maternal Uncle   . Cancer Maternal Grandfather        Lung  . Osteoarthritis Maternal Grandfather     . Diabetes Paternal Grandmother   . Osteoarthritis Paternal Grandmother   . Diabetes Maternal Grandmother   . Hypertension Maternal Grandmother   . Osteoarthritis Maternal Grandmother   . Osteoarthritis Paternal Grandfather    Past Surgical History:  Procedure Laterality Date  . CESAREAN SECTION    . CESAREAN SECTION WITH BILATERAL TUBAL LIGATION N/A 09/21/2012   Procedure: CESAREAN SECTION WITH BILATERAL TUBAL LIGATION;  Surgeon: Woodroe Mode, MD;  Location: [facility];  Service: Obstetrics;  Laterality: N/A;  . CRYOABLATION    . tubal ablation    . WISDOM TOOTH EXTRACTION     Social History   Social History Narrative  . No narrative on file     Objective: Vital Signs: There were no vitals taken for this visit.   Physical Exam   Musculoskeletal Exam: ***  CDAI Exam: No CDAI exam completed.    Investigation: No additional findings.   Imaging: No results found.  Speciality Comments: No specialty comments available.    Procedures:  No procedures performed Allergies: Patient has no known allergies.   Assessment / Plan:     Visit Diagnoses: Primary osteoarthritis of both hands  Primary osteoarthritis of both knees  Primary osteoarthritis of both feet  Psoriasis  Other fatigue (shift work)  ANA positive 1:160, ENA negative . No clinical features of autoimmune disease.  History of anemia    Orders: No orders of the defined types were placed in this encounter.  No orders of the defined types were placed in this encounter.   Face-to-face time  spent with patient was *** minutes. 50% of time was spent in counseling and coordination of care.  Follow-Up Instructions: No Follow-up on file.   Mumin Denomme, RT  Note - This record has been created using [employer].  Chart creation errors have been sought, but may not always  have been located. Such creation errors do not reflect on  the standard of medical care.

## 2016-11-12 ENCOUNTER — Ambulatory Visit: Payer: 59 | Admitting: Rheumatology
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
## 2017-03-22 ENCOUNTER — Encounter: Payer: 59 | Admitting: Gynecology

## 2017-09-06 ENCOUNTER — Ambulatory Visit (INDEPENDENT_AMBULATORY_CARE_PROVIDER_SITE_OTHER): Payer: 59 | Admitting: Family Medicine

## 2017-09-06 ENCOUNTER — Encounter: Payer: Self-pay | Admitting: Family Medicine

## 2017-09-06 VITALS — BP 100/64 | HR 67 | Ht 65.5 in | Wt 203.2 lb

## 2017-09-06 DIAGNOSIS — Z1239 Encounter for other screening for malignant neoplasm of breast: Secondary | ICD-10-CM

## 2017-09-06 DIAGNOSIS — Z0000 Encounter for general adult medical examination without abnormal findings: Secondary | ICD-10-CM | POA: Diagnosis not present

## 2017-09-06 DIAGNOSIS — L409 Psoriasis, unspecified: Secondary | ICD-10-CM

## 2017-09-06 DIAGNOSIS — E669 Obesity, unspecified: Secondary | ICD-10-CM

## 2017-09-06 DIAGNOSIS — Z8719 Personal history of other diseases of the digestive system: Secondary | ICD-10-CM

## 2017-09-06 DIAGNOSIS — Z8632 Personal history of gestational diabetes: Secondary | ICD-10-CM

## 2017-09-06 DIAGNOSIS — Z13220 Encounter for screening for lipoid disorders: Secondary | ICD-10-CM | POA: Diagnosis not present

## 2017-09-06 DIAGNOSIS — Z1231 Encounter for screening mammogram for malignant neoplasm of breast: Secondary | ICD-10-CM | POA: Diagnosis not present

## 2017-09-06 LAB — POCT URINALYSIS DIP (PROADVANTAGE DEVICE)
BILIRUBIN UA: NEGATIVE
BILIRUBIN UA: NEGATIVE mg/dL
Blood, UA: NEGATIVE
GLUCOSE UA: NEGATIVE mg/dL
LEUKOCYTES UA: NEGATIVE
Nitrite, UA: NEGATIVE
Protein Ur, POC: NEGATIVE mg/dL
Specific Gravity, Urine: 1.015
Urobilinogen, Ur: NEGATIVE
pH, UA: 6 (ref 5.0–8.0)

## 2017-09-06 NOTE — Progress Notes (Signed)
Subjective:    Patient ID: Renee Williams, female    DOB: February 27, 1971, 47 y.o.   MRN: [id number]  HPI Chief Complaint  Patient presents with  . new pt    new pt, fasting cpe. back pain,  having some gallbladder issues- had this back last summer but dealing with this for 20 some years   She is new to the practice and here for a complete physical exam. Previous medical care: Alanda Amass, PCP in [place], [place]. [facility] in [place]. Last CPE: one year ago   Other providers: Dr. Tomi Likens- migraines, Dr. Estanislado Pandy- lipoma  OB/GYN - gets annual exams   At risk for diabetes.  History of gestational diabetes. Family history of diabetes.  Turmeric and ginger root- for arthritic pain and this has helped with chronic pain and migraine.   Periods irregular since ablation.   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  Denies smoking but is exposed to second hand smoke, drinking alcohol, drug use  Diet: unhealthy. More sweets and carbohydrates. States she has gained significant weight over the past year Excerise: nothing regular but walks some days     Immunizations: [employer] employee  Health maintenance:  Mammogram: many years ago.  Colonoscopy: never  Last Gynecological Exam: pap smear 01/2016 Last Menstrual cycle: ablation and irregular  Last Dental Exam: twice annually  Last Eye Exam: in the past year   Wears seatbelt always, uses sunscreen, smoke detectors in home and functioning, does not text while driving and feels safe in home environment.   Reviewed allergies, medications, past medical, surgical, family, and social history.   Review of Systems Review of Systems Constitutional: -fever, -chills, -sweats, -unexpected weight change,-fatigue ENT: -runny nose, -ear pain, -sore throat Cardiology:  -chest pain, -palpitations, -edema Respiratory: -cough, -shortness of breath, -wheezing Gastroenterology: -abdominal  pain, -nausea, -vomiting, -diarrhea, -constipation  Hematology: -bleeding or bruising problems Musculoskeletal: -arthralgias, -myalgias, -joint swelling, -back pain Ophthalmology: -vision changes Urology: -dysuria, -difficulty urinating, -hematuria, -urinary frequency, -urgency Neurology: -headache, -weakness, -tingling, -numbness       Objective:   Physical Exam BP 100/64   Pulse 67   Ht 5' 5.5" (1.664 m)   Wt 203 lb 3.2 oz (92.2 kg)   Breastfeeding? No   BMI 33.30 kg/m   General Appearance:    Alert, cooperative, no distress, appears stated age  Head:    Normocephalic, without obvious abnormality, atraumatic  Eyes:    PERRL, conjunctiva/corneas clear, EOM's intact, fundi    benign  Ears:    Normal TM's and external ear canals  Nose:   Nares normal, mucosa normal, no drainage or sinus   tenderness  Throat:   Lips, mucosa, and tongue normal; teeth and gums normal  Neck:   Supple, no lymphadenopathy;  thyroid:  no   enlargement/tenderness/nodules; no carotid   bruit or JVD  Back:    Spine nontender, no curvature, ROM normal, no CVA     tenderness  Lungs:     Clear to auscultation bilaterally without wheezes, rales or     ronchi; respirations unlabored  Chest Wall:    No tenderness or deformity   Heart:    Regular rate and rhythm, S1 and S2 normal, no murmur, rub   or gallop  Breast Exam:   declines. Mammogram ordered.  Abdomen:     Soft, non-tender, nondistended, normoactive bowel sounds,    no masses, no hepatosplenomegaly  Genitalia:  Declined.  We will schedule with OB/GYN. Pap smear up to date.      Extremities:   No clubbing, cyanosis or edema  Pulses:   2+ and symmetric all extremities  Skin:   Skin color, texture, turgor normal, no rashes or lesions  Lymph nodes:   Cervical, supraclavicular, and axillary nodes normal  Neurologic:   CNII-XII intact, normal strength, sensation and gait; reflexes 2+ and symmetric throughout          Psych:   Normal mood, affect, hygiene  and grooming.    Urinalysis dipstick: negative       Assessment & Plan:  Routine general medical examination at a health care facility - Plan: POCT Urinalysis DIP (Proadvantage Device), CBC with Differential/Platelet, Comprehensive metabolic panel, TSH, Lipid panel  Personal history of gallstones  Screening for breast cancer - Plan: MM DIGITAL SCREENING BILATERAL  History of gestational diabetes - Plan: Hemoglobin A1c  Psoriasis  Obesity (BMI 30-39.9) - Plan: TSH, Lipid panel  Screening for lipid disorders - Plan: Lipid panel  She appears to be doing well overall.  She is in good spirits and pleasant. Mammogram ordered. She will call and schedule with an OB/GYN but she is up-to-date on Pap smear. Counseling done on healthy diet and exercise for obesity. Discussed that having gestational diabetes does place her at increased risk of developing diabetes and she does have a family history of diabetes as well.  I will check a hemoglobin A1c today. She is exposed to secondhand smoke. History of gallstones and asymptomatic.  She will let me know if this becomes more bothersome. Psoriasis is not bothersome and she is not on medication for this. Appears to be up-to-date on immunizations. Follow-up pending labs.

## 2017-09-06 NOTE — Patient Instructions (Addendum)
Call and schedule your mammogram.  Establish with an OB/GYN at your convenience.   We will call you with your lab results.   Preventative Care for Adults - Female      MAINTAIN REGULAR HEALTH EXAMS:  A routine yearly physical is a good way to check in with your primary care provider about your health and preventive screening. It is also an opportunity to share updates about your health and any concerns you have, and receive a thorough all-over exam.   Most health insurance companies pay for at least some preventative services.  Check with your health plan for specific coverages.  WHAT PREVENTATIVE SERVICES DO WOMEN NEED?  Adult women should have their weight and blood pressure checked regularly.   Women age 45 and older should have their cholesterol levels checked regularly.  Women should be screened for cervical cancer with a Pap smear and pelvic exam beginning at age 45.  Breast cancer screening generally begins at age 45 with a mammogram and breast exam by your primary care provider.    Beginning at age 23 and continuing to age 45, women should be screened for colorectal cancer.  Certain people may need continued testing until age 85.  Updating vaccinations is part of preventative care.  Vaccinations help protect against diseases such as the flu.  Osteoporosis is a disease in which the bones lose minerals and strength as we age. Women ages 10 and over should discuss this with their caregivers, as should women after menopause who have other risk factors.  Lab tests are generally done as part of preventative care to screen for anemia and blood disorders, to screen for problems with the kidneys and liver, to screen for bladder problems, to check blood sugar, and to check your cholesterol level.  Preventative services generally include counseling about diet, exercise, avoiding tobacco, drugs, excessive alcohol consumption, and sexually transmitted infections.    GENERAL  RECOMMENDATIONS FOR GOOD HEALTH:  Healthy diet:  Eat a variety of foods, including fruit, vegetables, animal or vegetable protein, such as meat, fish, chicken, and eggs, or beans, lentils, tofu, and grains, such as rice.  Drink plenty of water daily.  Decrease saturated fat in the diet, avoid lots of red meat, processed foods, sweets, fast foods, and fried foods.  Exercise:  Aerobic exercise helps maintain good heart health. At least 30-40 minutes of moderate-intensity exercise is recommended. For example, a brisk walk that increases your heart rate and breathing. This should be done on most days of the week.   Find a type of exercise or a variety of exercises that you enjoy so that it becomes a part of your daily life.  Examples are running, walking, swimming, water aerobics, and biking.  For motivation and support, explore group exercise such as aerobic class, spin class, Zumba, Yoga,or  martial arts, etc.    Set exercise goals for yourself, such as a certain weight goal, walk or run in a race such as a 5k walk/run.  Speak to your primary care provider about exercise goals.  Disease prevention:  If you smoke or chew tobacco, find out from your caregiver how to quit. It can literally save your life, no matter how long you have been a tobacco user. If you do not use tobacco, never begin.   Maintain a healthy diet and normal weight. Increased weight leads to problems with blood pressure and diabetes.   The Body Mass Index or BMI is a way of measuring how much of your  body is fat. Having a BMI above 27 increases the risk of heart disease, diabetes, hypertension, stroke and other problems related to obesity. Your caregiver can help determine your BMI and based on it develop an exercise and dietary program to help you achieve or maintain this important measurement at a healthful level.  High blood pressure causes heart and blood vessel problems.  Persistent high blood pressure should be treated  with medicine if weight loss and exercise do not work.   Fat and cholesterol leaves deposits in your arteries that can block them. This causes heart disease and vessel disease elsewhere in your body.  If your cholesterol is found to be high, or if you have heart disease or certain other medical conditions, then you may need to have your cholesterol monitored frequently and be treated with medication.   Ask if you should have a cardiac stress test if your history suggests this. A stress test is a test done on a treadmill that looks for heart disease. This test can find disease prior to there being a problem.  Menopause can be associated with physical symptoms and risks. Hormone replacement therapy is available to decrease these. You should talk to your caregiver about whether starting or continuing to take hormones is right for you.   Osteoporosis is a disease in which the bones lose minerals and strength as we age. This can result in serious bone fractures. Risk of osteoporosis can be identified using a bone density scan. Women ages 45 and over should discuss this with their caregivers, as should women after menopause who have other risk factors. Ask your caregiver whether you should be taking a calcium supplement and Vitamin D, to reduce the rate of osteoporosis.   Avoid drinking alcohol in excess (more than two drinks per day).  Avoid use of street drugs. Do not share needles with anyone. Ask for professional help if you need assistance or instructions on stopping the use of alcohol, cigarettes, and/or drugs.  Brush your teeth twice a day with fluoride toothpaste, and floss once a day. Good oral hygiene prevents tooth decay and gum disease. The problems can be painful, unattractive, and can cause other health problems. Visit your dentist for a routine oral and dental check up and preventive care every 6-12 months.   Look at your skin regularly.  Use a mirror to look at your back. Notify your  caregivers of changes in moles, especially if there are changes in shapes, colors, a size larger than a pencil eraser, an irregular border, or development of new moles.  Safety:  Use seatbelts 100% of the time, whether driving or as a passenger.  Use safety devices such as hearing protection if you work in environments with loud noise or significant background noise.  Use safety glasses when doing any work that could send debris in to the eyes.  Use a helmet if you ride a bike or motorcycle.  Use appropriate safety gear for contact sports.  Talk to your caregiver about gun safety.  Use sunscreen with a SPF (or skin protection factor) of 15 or greater.  Lighter skinned people are at a greater risk of skin cancer. Don't forget to also wear sunglasses in order to protect your eyes from too much damaging sunlight. Damaging sunlight can accelerate cataract formation.   Practice safe sex. Use condoms. Condoms are used for birth control and to help reduce the spread of sexually transmitted infections (or STIs).  Some of the STIs are gonorrhea (the   clap), chlamydia, syphilis, trichomonas, herpes, HPV (human papilloma virus) and HIV (human immunodeficiency virus) which causes AIDS. The herpes, HIV and HPV are viral illnesses that have no cure. These can result in disability, cancer and death.   Keep carbon monoxide and smoke detectors in your home functioning at all times. Change the batteries every 6 months or use a model that plugs into the wall.   Vaccinations:  Stay up to date with your tetanus shots and other required immunizations. You should have a booster for tetanus every 10 years. Be sure to get your flu shot every year, since 5%-20% of the [place] population comes down with the flu. The flu vaccine changes each year, so being vaccinated once is not enough. Get your shot in the fall, before the flu season peaks.   Other vaccines to consider:  Human Papilloma Virus or HPV causes cancer of the cervix,  and other infections that can be transmitted from person to person. There is a vaccine for HPV, and females should get immunized between the ages of 95 and 107. It requires a series of 3 shots.   Pneumococcal vaccine to protect against certain types of pneumonia.  This is normally recommended for adults age 38 or older.  However, adults younger than 45 years old with certain underlying conditions such as diabetes, heart or lung disease should also receive the vaccine.  Shingles vaccine to protect against Varicella Zoster if you are older than age 66, or younger than 45 years old with certain underlying illness.  Hepatitis A vaccine to protect against a form of infection of the liver by a virus acquired from food.  Hepatitis B vaccine to protect against a form of infection of the liver by a virus acquired from blood or body fluids, particularly if you work in health care.  If you plan to travel internationally, check with your local health department for specific vaccination recommendations.  Cancer Screening:  Breast cancer screening is essential to preventive care for women. All women age 54 and older should perform a breast self-exam every month. At age 54 and older, women should have their caregiver complete a breast exam each year. Women at ages 30 and older should have a mammogram (x-ray film) of the breasts. Your caregiver can discuss how often you need mammograms.    Cervical cancer screening includes taking a Pap smear (sample of cells examined under a microscope) from the cervix (end of the uterus). It also includes testing for HPV (Human Papilloma Virus, which can cause cervical cancer). Screening and a pelvic exam should begin at age 55.  Screening should occur every year, with a Pap smear but no HPV testing, up to age 50. After age 24, you should have a Pap smear every 3 years with HPV testing, if no HPV was found previously.   Most routine colon cancer screening begins at the age of 18.  On a yearly basis, doctors may provide special easy to use take-home tests to check for hidden blood in the stool. Sigmoidoscopy or colonoscopy can detect the earliest forms of colon cancer and is life saving. These tests use a small camera at the end of a tube to directly examine the colon. Speak to your caregiver about this at age 22, when routine screening begins (and is repeated every 5 years unless early forms of pre-cancerous polyps or small growths are found).

## 2017-09-07 LAB — COMPREHENSIVE METABOLIC PANEL
ALBUMIN: 4.4 g/dL (ref 3.5–5.5)
ALK PHOS: 94 IU/L (ref 39–117)
ALT: 22 IU/L (ref 0–32)
AST: 16 IU/L (ref 0–40)
Albumin/Globulin Ratio: 2.2 (ref 1.2–2.2)
BILIRUBIN TOTAL: 1.1 mg/dL (ref 0.0–1.2)
BUN / CREAT RATIO: 15 (ref 9–23)
BUN: 8 mg/dL (ref 6–24)
CO2: 22 mmol/L (ref 20–29)
CREATININE: 0.55 mg/dL — AB (ref 0.57–1.00)
Calcium: 8.9 mg/dL (ref 8.7–10.2)
Chloride: 107 mmol/L — ABNORMAL HIGH (ref 96–106)
GFR calc Af Amer: 132 mL/min/{1.73_m2} (ref >59)
GFR calc non Af Amer: 114 mL/min/{1.73_m2} (ref >59)
GLUCOSE: 93 mg/dL (ref 65–99)
Globulin, Total: 2 g/dL (ref 1.5–4.5)
POTASSIUM: 4 mmol/L (ref 3.5–5.2)
Sodium: 144 mmol/L (ref 134–144)
Total Protein: 6.4 g/dL (ref 6.0–8.5)

## 2017-09-07 LAB — CBC WITH DIFFERENTIAL/PLATELET
BASOS: 1 %
Basophils Absolute: 0 10*3/uL (ref 0.0–0.2)
EOS (ABSOLUTE): 0.2 10*3/uL (ref 0.0–0.4)
EOS: 3 %
HEMATOCRIT: 39 % (ref 34.0–46.6)
HEMOGLOBIN: 13 g/dL (ref 11.1–15.9)
Immature Grans (Abs): 0 10*3/uL (ref 0.0–0.1)
Immature Granulocytes: 0 %
LYMPHS ABS: 1.5 10*3/uL (ref 0.7–3.1)
Lymphs: 27 %
MCH: 31.3 pg (ref 26.6–33.0)
MCHC: 33.3 g/dL (ref 31.5–35.7)
MCV: 94 fL (ref 79–97)
MONOCYTES: 6 %
MONOS ABS: 0.3 10*3/uL (ref 0.1–0.9)
NEUTROS ABS: 3.4 10*3/uL (ref 1.4–7.0)
Neutrophils: 63 %
Platelets: 210 10*3/uL (ref 150–379)
RBC: 4.15 x10E6/uL (ref 3.77–5.28)
RDW: 14.9 % (ref 12.3–15.4)
WBC: 5.4 10*3/uL (ref 3.4–10.8)

## 2017-09-07 LAB — LIPID PANEL
CHOL/HDL RATIO: 3 ratio (ref 0.0–4.4)
Cholesterol, Total: 179 mg/dL (ref 100–199)
HDL: 60 mg/dL (ref >39)
LDL Calculated: 97 mg/dL (ref 0–99)
Triglycerides: 112 mg/dL (ref 0–149)
VLDL Cholesterol Cal: 22 mg/dL (ref 5–40)

## 2017-09-07 LAB — HEMOGLOBIN A1C
ESTIMATED AVERAGE GLUCOSE: 82 mg/dL
HEMOGLOBIN A1C: 4.5 % — AB (ref 4.8–5.6)

## 2017-09-07 LAB — TSH: TSH: 3.29 u[IU]/mL (ref 0.450–4.500)

## 2017-12-02 ENCOUNTER — Encounter: Payer: Self-pay | Admitting: Nurse Practitioner

## 2017-12-02 ENCOUNTER — Ambulatory Visit (INDEPENDENT_AMBULATORY_CARE_PROVIDER_SITE_OTHER): Payer: Self-pay | Admitting: Nurse Practitioner

## 2017-12-02 VITALS — BP 112/80 | HR 80 | Temp 98.7°F | Wt 211.2 lb

## 2017-12-02 DIAGNOSIS — M722 Plantar fascial fibromatosis: Secondary | ICD-10-CM

## 2017-12-02 DIAGNOSIS — S90821D Blister (nonthermal), right foot, subsequent encounter: Secondary | ICD-10-CM

## 2017-12-02 MED ORDER — MUPIROCIN 2 % EX OINT: 1.0000 "application " | TOPICAL_OINTMENT | Freq: Two times a day (BID) | CUTANEOUS | 0 refills | Status: AC

## 2017-12-02 MED ORDER — DICLOFENAC POTASSIUM 50 MG PO TABS: 50.0000 mg | ORAL_TABLET | Freq: Two times a day (BID) | ORAL | 0 refills | Status: AC

## 2017-12-02 MED ORDER — DICLOFENAC POTASSIUM 25 MG PO CAPS
25.0000 mg | ORAL_CAPSULE | Freq: Two times a day (BID) | ORAL | 0 refills | Status: DC
Start: 2017-12-02 — End: 2017-12-02

## 2017-12-02 NOTE — Progress Notes (Signed)
Subjective:    Renee Williams is a 47 y.o. female who presents with right foot pain. Onset of the symptoms was 2 weeks ago. Precipitating event: patient states she had a blister on the bottom of her right foot which caused her to walk "funny".. Current symptoms include: ability to bear weight, but with some pain and pain along the lateral aspect and medial aspect of her foot radiating into the heel. . Aggravating factors: any weight bearing. Symptoms have progressed to a point and plateaued. Patient has had prior foot problems. Evaluation to date: x-rays and ultrasound. Treatment to date: use of Tumeric and proper footwear.  Patient states she has been previously diagnosed with plantar fasciitis and osteoarthritis of her feet.  The patient states that over the last week she has developed a blister on the bottom of her right foot on the lateral plantar nerve.  The patient states is unsure if she stepped on glass broken window at home.  The patient states she did "pop" blister on the bottom of her foot, since that time has developed a hard callused area on the bottom of her foot.  The patient states when she presses down on that area of her foot it feels like there is "something in it ".  The following portions of the patient's history were reviewed and updated as appropriate: allergies, current medications and past medical history.  Review of Systems Constitutional: negative Ears, nose, mouth, throat, and face: negative Respiratory: negative Cardiovascular: negative Integument/breast: negative except for Blister to bottom of right foot, redness Musculoskeletal:positive for Right foot pain, negative for back pain, muscle weakness, myalgias and stiff joints    Objective:    BP 112/80   Pulse 80   Temp 98.7 F (37.1 C)   Wt 211 lb 3.2 oz (95.8 kg)   SpO2 99%   BMI 34.61 kg/m  Right foot:  soft tissue swelling noted over the Lateral aspect of the ball of the right foot and point tenderness over the  heel pad; callused area to lateral aspect of the ball of the right foot, area measures approximately 1 cm x 1 cm.  No fluctuance, no drainage, tender to palpation  Left foot:  normal exam, no swelling, tenderness, instability; ligaments intact, full range of motion of all ankle/foot joints    Patient requested I&D of the area to see if there was any-appearing drainage.  Area was cleaned with iodine and gauze.  Injected ball of right foot with 1 mL of lidocaine 1%.  Used a #10 scalpel to remove the callused area, no mucopurulent drainage, sanguinous drainage.  No foreign object or foreign body noted in the foot.  The site was cleansed with normal saline.  Antibiotic ointment was applied, gauze applied to bottom of right foot with Cobian dressing.  Patient tolerated well.    Assessment:   Plantar Fascitis of Right Foot Blister of Right Foot   Plan:   Exam findings, diagnosis etiology and medication use and indications reviewed with patient. Follow- Up and discharge instructions provided. No emergent/urgent issues found on exam.  Discussed with patient clinical diagnoses were made based on clinical assessment.  Letter has no means to perform x-ray or imaging in the clinic.  Patient instructed to follow-up with her PCP. Patient verbalized understanding of information provided and agrees with plan of care (POC), all questions answered.  1. Plantar fasciitis, right  - Diclofenac Potassium 25 MG CAPS; Take 1 capsule (25 mg total) by mouth 2 (two) times daily  for 14 days.  Dispense: 28 capsule; Refill: 0 -Attempt exercises provided. -Follow up with PCP if symptoms do not improve.   2. Blister of right foot, subsequent encounter  - mupirocin ointment (BACTROBAN) 2 %; Apply 1 application topically 2 (two) times daily for 14 days. Apply to the affected area twice daily for 14 days or until symptoms improve.  Dispense: 30 g; Refill: 0 -Keep blister on right foot clean and dry.  Apply mupirocin  ointment twice daily until symptoms improve. -Soak right foot twice daily in Epsom salt soak. -Follow up if you develop fever, chills, redness, swelling or foul smelling drainage.

## 2017-12-02 NOTE — Patient Instructions (Signed)
Plantar Fasciitis Diclofenac 25mg  twice daily for 14 days.  If symptoms do not improve, follow up with PCP. Keep blister on right foot clean and dry.  Apply mupirocin ointment twice daily until symptoms improve. Soak right foot twice daily in Epsom salt soak. Follow up if you develop fever, chills, redness, swelling or foul smelling drainage.   Plantar fasciitis is a painful foot condition that affects the heel. It occurs when the band of tissue that connects the toes to the heel bone (plantar fascia) becomes irritated. This can happen after exercising too much or doing other repetitive activities (overuse injury). The pain from plantar fasciitis can range from mild irritation to severe pain that makes it difficult for you to walk or move. The pain is usually worse in the morning or after you have been sitting or lying down for a while. What are the causes? This condition may be caused by:  Standing for long periods of time.  Wearing shoes that do not fit.  Doing high-impact activities, including running, aerobics, and ballet.  Being overweight.  Having an abnormal way of walking (gait).  Having tight calf muscles.  Having high arches in your feet.  Starting a new athletic activity.  What are the signs or symptoms? The main symptom of this condition is heel pain. Other symptoms include:  Pain that gets worse after activity or exercise.  Pain that is worse in the morning or after resting.  Pain that goes away after you walk for a few minutes.  How is this diagnosed? This condition may be diagnosed based on your signs and symptoms. Your health care provider will also do a physical exam to check for:  A tender area on the bottom of your foot.  A high arch in your foot.  Pain when you move your foot.  Difficulty moving your foot.  You may also need to have imaging studies to confirm the diagnosis. These can include:  X-rays.  Ultrasound.  MRI.  How is this  treated? Treatment for plantar fasciitis depends on the severity of the condition. Your treatment may include:  Rest, ice, and over-the-counter pain medicines to manage your pain.  Exercises to stretch your calves and your plantar fascia.  A splint that holds your foot in a stretched, upward position while you sleep (night splint).  Physical therapy to relieve symptoms and prevent problems in the future.  Cortisone injections to relieve severe pain.  Extracorporeal shock wave therapy (ESWT) to stimulate damaged plantar fascia with electrical impulses. It is often used as a last resort before surgery.  Surgery, if other treatments have not worked after 12 months.  Follow these instructions at home:  Take medicines only as directed by your health care provider.  Avoid activities that cause pain.  Roll the bottom of your foot over a bag of ice or a bottle of cold water. Do this for 20 minutes, 3-4 times a day.  Perform simple stretches as directed by your health care provider.  Try wearing athletic shoes with air-sole or gel-sole cushions or soft shoe inserts.  Wear a night splint while sleeping, if directed by your health care provider.  Keep all follow-up appointments with your health care provider. How is this prevented?  Do not perform exercises or activities that cause heel pain.  Consider finding low-impact activities if you continue to have problems.  Lose weight if you need to. The best way to prevent plantar fasciitis is to avoid the activities that aggravate your  plantar fascia. Contact a health care provider if:  Your symptoms do not go away after treatment with home care measures.  Your pain gets worse.  Your pain affects your ability to move or do your daily activities. This information is not intended to replace advice given to you by your health care provider. Make sure you discuss any questions you have with your health care provider. Document Released:  01/13/2001 Document Revised: 09/23/2015 Document Reviewed: 02/28/2014 Elsevier Interactive Patient Education  2018 [place].  Plantar Fasciitis Rehab Ask your health care provider which exercises are safe for you. Do exercises exactly as told by your health care provider and adjust them as directed. It is normal to feel mild stretching, pulling, tightness, or discomfort as you do these exercises, but you should stop right away if you feel sudden pain or your pain gets worse. Do not begin these exercises until told by your health care provider. Stretching and range of motion exercises These exercises warm up your muscles and joints and improve the movement and flexibility of your foot. These exercises also help to relieve pain. Exercise A: Plantar fascia stretch  1. Sit with your left / right leg crossed over your opposite knee. 2. Hold your heel with one hand with that thumb near your arch. With your other hand, hold your toes and gently pull them back toward the top of your foot. You should feel a stretch on the bottom of your toes or your foot or both. 3. Hold this stretch for__________ seconds. 4. Slowly release your toes and return to the starting position. Repeat __________ times. Complete this exercise __________ times a day. Exercise B: Gastroc, standing  1. Stand with your hands against a wall. 2. Extend your left / right leg behind you, and bend your front knee slightly. 3. Keeping your heels on the floor and keeping your back knee straight, shift your weight toward the wall without arching your back. You should feel a gentle stretch in your left / right calf. 4. Hold this position for __________ seconds. Repeat __________ times. Complete this exercise __________ times a day. Exercise C: Soleus, standing 1. Stand with your hands against a wall. 2. Extend your left / right leg behind you, and bend your front knee slightly. 3. Keeping your heels on the floor, bend your back knee  and slightly shift your weight over the back leg. You should feel a gentle stretch deep in your calf. 4. Hold this position for __________ seconds. Repeat __________ times. Complete this exercise __________ times a day. Exercise D: Gastrocsoleus, standing 1. Stand with the ball of your left / right foot on a step. The ball of your foot is on the walking surface, right under your toes. 2. Keep your other foot firmly on the same step. 3. Hold onto the wall or a railing for balance. 4. Slowly lift your other foot, allowing your body weight to press your heel down over the edge of the step. You should feel a stretch in your left / right calf. 5. Hold this position for __________ seconds. 6. Return both feet to the step. 7. Repeat this exercise with a slight bend in your left / right knee. Repeat __________ times with your left / right knee straight and __________ times with your left / right knee bent. Complete this exercise __________ times a day. Balance exercise This exercise builds your balance and strength control of your arch to help take pressure off your plantar fascia. Exercise E:  Single leg stand 1. Without shoes, stand near a railing or in a doorway. You may hold onto the railing or door frame as needed. 2. Stand on your left / right foot. Keep your big toe down on the floor and try to keep your arch lifted. Do not let your foot roll inward. 3. Hold this position for __________ seconds. 4. If this exercise is too easy, you can try it with your eyes closed or while standing on a pillow. Repeat __________ times. Complete this exercise __________ times a day. This information is not intended to replace advice given to you by your health care provider. Make sure you discuss any questions you have with your health care provider. Document Released: 04/20/2005 Document Revised: 12/24/2015 Document Reviewed: 03/04/2015 Elsevier Interactive Patient Education  2018 [place],  Adult Taking care of your wound properly can help to prevent pain and infection. It can also help your wound to heal more quickly. How is this treated? Wound care  Follow instructions from your health care provider about how to take care of your wound. Make sure you: ? Wash your hands with soap and water before you change the bandage (dressing). If soap and water are not available, use hand sanitizer. ? Change your dressing as told by your health care provider. ? Leave stitches (sutures), skin glue, or adhesive strips in place. These skin closures may need to stay in place for 2 weeks or longer. If adhesive strip edges start to loosen and curl up, you may trim the loose edges. Do not remove adhesive strips completely unless your health care provider tells you to do that.  Check your wound area every day for signs of infection. Check for: ? More redness, swelling, or pain. ? More fluid or blood. ? Warmth. ? Pus or a bad smell.  Ask your health care provider if you should clean the wound with mild soap and water. Doing this may include: ? Using a clean towel to pat the wound dry after cleaning it. Do not rub or scrub the wound. ? Applying a cream or ointment. Do this only as told by your health care provider. ? Covering the incision with a clean dressing.  Ask your health care provider when you can leave the wound uncovered. Medicines   If you were prescribed an antibiotic medicine, cream, or ointment, take or use the antibiotic as told by your health care provider. Do not stop taking or using the antibiotic even if your condition improves.  Take over-the-counter and prescription medicines only as told by your health care provider. If you were prescribed pain medicine, take it at least 30 minutes before doing any wound care or as told by your health care provider. General instructions  Return to your normal activities as told by your health care provider. Ask your health care provider  what activities are safe.  Do not scratch or pick at the wound.  Keep all follow-up visits as told by your health care provider. This is important.  Eat a diet that includes protein, vitamin A, vitamin C, and other nutrient-rich foods. These help the wound heal: ? Protein-rich foods include meat, dairy, beans, nuts, and other sources. ? Vitamin A-rich foods include carrots and dark green, leafy vegetables. ? Vitamin C-rich foods include citrus, tomatoes, and other fruits and vegetables. ? Nutrient-rich foods have protein, carbohydrates, fat, vitamins, or minerals. Eat a variety of healthy foods including vegetables, fruits, and whole grains. Contact a health care  provider if:  You received a tetanus shot and you have swelling, severe pain, redness, or bleeding at the injection site.  Your pain is not controlled with medicine.  You have more redness, swelling, or pain around the wound.  You have more fluid or blood coming from the wound.  Your wound feels warm to the touch.  You have pus or a bad smell coming from the wound.  You have a fever or chills.  You are nauseous or you vomit.  You are dizzy. Get help right away if:  You have a red streak going away from your wound.  The edges of the wound open up and separate.  Your wound is bleeding and the bleeding does not stop with gentle pressure.  You have a rash.  You faint.  You have trouble breathing. This information is not intended to replace advice given to you by your health care provider. Make sure you discuss any questions you have with your health care provider. Document Released: 01/28/2008 Document Revised: 12/18/2015 Document Reviewed: 11/05/2015 Elsevier Interactive Patient Education  2017 [employer].

## 2017-12-27 ENCOUNTER — Ambulatory Visit (INDEPENDENT_AMBULATORY_CARE_PROVIDER_SITE_OTHER): Payer: Self-pay | Admitting: Family Medicine

## 2017-12-27 VITALS — BP 110/75 | HR 87 | Temp 97.5°F | Resp 18 | Wt 206.2 lb

## 2017-12-27 DIAGNOSIS — B070 Plantar wart: Secondary | ICD-10-CM

## 2017-12-27 NOTE — Patient Instructions (Addendum)
Warts Warts are small growths on the skin. They are common and can occur on various areas of the body. A person may have one wart or multiple warts. Most warts are not painful, and they usually do not cause problems. However, warts can cause pain if they are large or occur in an area of the body where pressure will be applied to them, such as the bottom of the foot. In many cases, warts do not require treatment. They usually go away on their own over a period of many months to a couple years. Various treatments may be done for warts that cause problems or do not go away. Sometimes, warts go away and then come back again. What are the causes? Warts are caused by a type of virus that is called human papillomavirus (HPV). This virus can spread from person to person through direct contact. Warts can also spread to other areas of the body when a person scratches a wart and then scratches another area of his or her body. What increases the risk? Warts are more likely to develop in:  People who are 44-52 years of age.  People who have a weakened body defense system (immune system).  What are the signs or symptoms? A wart may be round or oval or have an irregular shape. Most warts have a rough surface. Warts may range in color from skin color to light yellow, brown, or gray. They are generally less than  inch (1.3 cm) in size. Most warts are painless, but some can be painful when pressure is applied to them. How is this diagnosed? A wart can usually be diagnosed from its appearance. In some cases, a tissue sample may be removed (biopsy) to be looked at under a microscope. How is this treated? In many cases, warts do not need treatment. If treatment is needed, options may include:  Applying medicated solutions, creams, or patches to the wart. These may be over-the-counter or prescription medicines that make the skin soft so that layers will gradually shed away. In many cases, the medicine is applied one  or two times per day and covered with a bandage.  Putting duct tape over the top of the wart (occlusion). You will leave the tape in place for as long as told by your health care provider, then you will replace it with a new strip of tape. This is done until the wart goes away.  Freezing the wart with liquid nitrogen (cryotherapy).  Burning the wart with: ? Laser treatment. ? An electrified probe (electrocautery).  Injection of a medicine (Candida antigen) into the wart to help the body's immune system to fight off the wart.  Surgery to remove the wart.  Follow these instructions at home:  Apply over-the-counter and prescription medicines only as told by your health care provider.  Do not apply over-the-counter wart medicines to your face or genitals before you ask your health care provider if it is okay to do so.  Do not scratch or pick at a wart.  Wash your hands after you touch a wart.  Avoid shaving hair that is over a wart.  Keep all follow-up visits as told by your health care provider. This is important. Contact a health care provider if:  Your warts do not improve after treatment.  You have redness, swelling, or pain at the site of a wart.  You have bleeding from a wart that does not stop with light pressure.  You have diabetes and you develop a  wart. This information is not intended to replace advice given to you by your health care provider. Make sure you discuss any questions you have with your health care provider. Document Released: 01/28/2005 Document Revised: 10/02/2015 Document Reviewed: 07/16/2014 Elsevier Interactive Patient Education  2018 [employer]  

## 2017-12-27 NOTE — Progress Notes (Signed)
Renee Williams is a 47 y.o. female who presents today with concerns of an open area to the bottom of her left foot that she originally believed was glass on her foot due to frequent walking at home without shoes she now believes that this is a plantar wart or callus. Of note she has chronic plantar fasciitis and was recently underwent minor in office procedure in an effort to remove "glass from her foot". She has since that time reported that the pain has not improved by now she believes that this area is a wart and has been utilizing occlusive dressings on the area as treatment with only minimal relief. 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   Review of Systems  Constitutional: Negative for chills, fever and malaise/fatigue.  HENT: Negative for congestion, ear discharge, ear pain, sinus pain and sore throat.   Eyes: Negative.   Respiratory: Negative for cough, sputum production and shortness of breath.   Cardiovascular: Negative.  Negative for chest pain.  Gastrointestinal: Negative for abdominal pain, diarrhea, nausea and vomiting.  Genitourinary: Negative for dysuria, frequency, hematuria and urgency.  Musculoskeletal: Negative for myalgias.       Left foot pain  Skin: Negative.   Neurological: Negative for headaches.  Endo/Heme/Allergies: Negative.   Psychiatric/Behavioral: Negative.     O: Vitals:   12/27/17 1213  BP: 110/75  Pulse: 87  Resp: 18  Temp: (!) 97.5 F (36.4 C)  SpO2: 98%     Physical Exam  Constitutional: She is oriented to person, place, and time. Vital signs are normal. She appears well-developed and well-nourished. She is active.  Non-toxic appearance. She does not have a sickly appearance.  HENT:  Head: Normocephalic.  Right Ear: Hearing, tympanic membrane, external ear and ear canal normal.  Left Ear: Hearing, tympanic membrane, external ear and ear canal normal.  Nose: Nose  normal.  Mouth/Throat: Uvula is midline and oropharynx is clear and moist.  Neck: Normal range of motion. Neck supple.  Cardiovascular: Normal rate, regular rhythm, normal heart sounds and normal pulses.  Pulmonary/Chest: Effort normal and breath sounds normal.  Abdominal: Soft. Bowel sounds are normal.  Musculoskeletal: Normal range of motion.       Left foot: There is normal range of motion and no deformity.  Feet:  Left Foot:  Skin Integrity: Positive for erythema, callus and dry skin. Negative for ulcer, blister, skin breakdown or warmth.  Lymphadenopathy:       Head (right side): No submental and no submandibular adenopathy present.       Head (left side): No submental and no submandibular adenopathy present.    She has no cervical adenopathy.  Neurological: She is alert and oriented to person, place, and time.  Skin:  Right foot not examined- left foot presence of heel skin thickening and callused areas to lateral bunion edge of great toe as well as small area to plantar side of great toe. Of note area of concern is approx  0.5 cm x 0.5 cm a round thickened area of skin to left lateral plantar area of foot. Seemingly consistent with a wart. Area is tender to palpation but no erythema noted- lesion is white as patient reports she has applied multiple wart treatments to area that include filing down skin, applying topicals and using over the counter topical occlusive dressings.  Psychiatric: She has a normal mood and affect.  Vitals reviewed.  A: 1. Plantar wart    P: Discussed exam findings, diagnosis etiology and medication use and indications reviewed with patient. Follow- Up and discharge instructions provided. No emergent/urgent issues found on exam.  Patient verbalized understanding of information provided and agrees with plan of care (POC), all questions answered.  1. Plantar wart Advised to seek comprehensive care with podiatry or primary care.  Other orders - ibuprofen  (ADVIL,MOTRIN) 200 MG tablet; Take 200 mg by mouth every 6 (six) hours as needed.

## 2017-12-28 ENCOUNTER — Encounter: Payer: Self-pay | Admitting: Family Medicine

## 2017-12-29 ENCOUNTER — Telehealth: Payer: Self-pay

## 2017-12-29 NOTE — Telephone Encounter (Signed)
Patient did not answered the phone 

## 2018-01-31 NOTE — Telephone Encounter (Signed)
Phone call

## 2018-08-30 ENCOUNTER — Ambulatory Visit (INDEPENDENT_AMBULATORY_CARE_PROVIDER_SITE_OTHER): Payer: No Typology Code available for payment source | Admitting: Family Medicine

## 2018-08-30 ENCOUNTER — Other Ambulatory Visit: Payer: Self-pay

## 2018-08-30 ENCOUNTER — Encounter: Payer: Self-pay | Admitting: Family Medicine

## 2018-08-30 VITALS — BP 120/80 | HR 110 | Temp 98.7°F | Wt 194.0 lb

## 2018-08-30 DIAGNOSIS — R52 Pain, unspecified: Secondary | ICD-10-CM | POA: Diagnosis not present

## 2018-08-30 DIAGNOSIS — R0982 Postnasal drip: Secondary | ICD-10-CM | POA: Diagnosis not present

## 2018-08-30 DIAGNOSIS — R509 Fever, unspecified: Secondary | ICD-10-CM | POA: Diagnosis not present

## 2018-08-30 DIAGNOSIS — J029 Acute pharyngitis, unspecified: Secondary | ICD-10-CM

## 2018-08-30 DIAGNOSIS — J3089 Other allergic rhinitis: Secondary | ICD-10-CM

## 2018-08-30 NOTE — Progress Notes (Signed)
Subjective:   Interactive audio and video telecommunications were attempted between this provider and patient, however due to patient not having access to video capability, we continued and completed visit with audio only.  The patient was located at home after two other call attempts where she reported being in a "convenience store" and our call was dropped both times.  The provider was located in the office. The patient did consent to this visit and is aware of possible charges through their insurance for this visit.  The other persons participating in this telemedicine service were none.    Patient ID: Renee Williams, female    DOB: 1971/04/29, 48 y.o.   MRN: [id number]  HPI Chief Complaint  Patient presents with  . 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 asked to be tested for flu/coronavirus but told she couldn't cause didn't cause no fever. been in [facility] for a week. went [facility]   States one week ago she developed diarrhea, fatigue, back pain, then 4-5 days ago she developed low grade fever, sore throat, post nasal drainage and dry cough. States she felt like she had her usual allergies with rhinorrhea, itchy, watery eyes and sneezing but then symptoms worsened.   Denies chest congestion, shortness of breath. No dizziness, abdominal pain, N/V, urinary symptoms.  No sinus pressure.   Diarrhea has resolved.  Fever has also resolved per patient.  States she thinks back pain is related to her arthritis.  Taking Tylenol or ibuprofen as needed.   She is requesting medication to help "knock this out quicker" States she thinks she has flu or may be strep throat.  Requesting antibiotic.  Denies difficulty swallowing. Sore throat is resolving.   States Health at work told her to go to [place] so she went there this morning and did not have a fever.   States they listen to her lungs and said they were normal.  States she works at [employer]. No known exposure to COVID-19.  Reviewed allergies, medications, past medical, surgical, family, and social history.   Review of Systems Pertinent positives and negatives in the history of present illness.     Objective:   Physical Exam BP 120/80   Pulse (!) 110   Temp 98.7 F (37.1 C) (Oral)   Wt 194 lb (88 kg)   SpO2 94%   BMI 31.79 kg/m   Alert and oriented and in no acute distress.  Respirations unlabored.  Speaking in complete sentences without difficulty.  Normal speech, mood and thought process.      Assessment & Plan:  Acute pharyngitis, unspecified etiology  Post-nasal drainage  Low grade fever  Body aches  Environmental and seasonal allergies  Discussed limits of virtual visit.  She declined video visit so we continued via telephone. She is in no acute distress.  Her symptoms are apparently improving. 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 She reports going to [facility] this morning and being examined however I do not see this visit in her medical record. Discussed that she appears to have a viral illness at this point and antibiotics are not warranted. Allergies also appear to be contributing so I recommend that she continue treating these. Discussed supportive care with hydration, rest, Tylenol, Mucinex and salt water gargles.  I also recommend throat lozenges. Discussed  that I do not have the ability to test her for COVID-19 and that she should consult with health at work.  No known exposure per patient. She reports that she has been quarantined however she did admit that she was inside a convenient store when I called her the first time.  I emphasized the need for her to stay quarantined until symptoms resolve. She requested Tamiflu however I made her aware that she is outside of the window for this to be effective.  Follow-up if symptoms are worsening or if she does not continue to improve.  I will rely on health at work to determine return to work date.   Time spent on call was 17 minutes and in review of previous records 1 minutes total.  This virtual service is not related to other E/M service within previous 7 days.

## 2018-09-06 ENCOUNTER — Encounter (HOSPITAL_COMMUNITY): Payer: Self-pay

## 2018-09-06 ENCOUNTER — Ambulatory Visit (HOSPITAL_COMMUNITY)
Admission: EM | Admit: 2018-09-06 | Discharge: 2018-09-06 | Disposition: A | Discharge status: Home / Self Care | Payer: Self-pay | Attending: Physician Assistant | Admitting: Physician Assistant

## 2018-09-06 ENCOUNTER — Other Ambulatory Visit: Payer: Self-pay

## 2018-09-06 DIAGNOSIS — M545 Low back pain, unspecified: Secondary | ICD-10-CM

## 2018-09-06 LAB — POCT URINALYSIS DIP (DEVICE)
Bilirubin Urine: NEGATIVE
Glucose, UA: NEGATIVE mg/dL
Hgb urine dipstick: NEGATIVE
Ketones, ur: NEGATIVE mg/dL
Leukocytes,Ua: NEGATIVE
Nitrite: NEGATIVE
Protein, ur: NEGATIVE mg/dL
Specific Gravity, Urine: 1.01 (ref 1.005–1.030)
Urobilinogen, UA: 1 mg/dL (ref 0.0–1.0)
pH: 7 (ref 5.0–8.0)

## 2018-09-06 MED ORDER — CYCLOBENZAPRINE HCL 10 MG PO TABS: 10.0000 mg | ORAL_TABLET | Freq: Every day | ORAL | 0 refills | Status: AC

## 2018-09-06 MED ORDER — METHOCARBAMOL 500 MG PO TABS: 500.0000 mg | ORAL_TABLET | Freq: Two times a day (BID) | ORAL | 0 refills | Status: DC

## 2018-09-06 MED ORDER — METHOCARBAMOL 500 MG PO TABS: 500.0000 mg | ORAL_TABLET | Freq: Two times a day (BID) | ORAL | 0 refills | Status: AC

## 2018-09-06 MED ORDER — DICLOFENAC SODIUM 75 MG PO TBEC: 75.0000 mg | DELAYED_RELEASE_TABLET | Freq: Two times a day (BID) | ORAL | 0 refills | Status: DC

## 2018-09-06 MED ORDER — DICLOFENAC SODIUM 75 MG PO TBEC: 75.0000 mg | DELAYED_RELEASE_TABLET | Freq: Two times a day (BID) | ORAL | 0 refills | Status: AC

## 2018-09-06 MED ORDER — CYCLOBENZAPRINE HCL 10 MG PO TABS: 10.0000 mg | ORAL_TABLET | Freq: Every day | ORAL | 0 refills | Status: DC

## 2018-09-06 NOTE — ED Provider Notes (Signed)
[facility]    CSN: [id number] Arrival date & time: 09/06/18  1009     History   Chief Complaint Chief Complaint  Patient presents with  . Back Pain    HPI Renee Williams is a 48 y.o. female.   The history is provided by the patient. No language interpreter was used.  Back Pain  Location:  Generalized Quality:  Aching Pain severity:  Moderate Onset quality:  Gradual Timing:  Constant Progression:  Worsening Chronicity:  New Relieved by:  Nothing Worsened by:  Nothing Ineffective treatments:  None tried Associated symptoms: no abdominal pain   Pt complains of low back pain.  Pt is concerned that she has a uti   Past Medical History:  Diagnosis Date  . Gallstones   . Gestational diabetes    Diet controlled  . HELLP syndrome 2009  . Migraines   . Pregnancy induced hypertension   . Psoriasis     Patient Active Problem List   Diagnosis Date Noted  . Primary osteoarthritis of both hands 05/03/2016  . Primary osteoarthritis of both feet 05/03/2016  . Primary osteoarthritis of both knees 05/03/2016  . Psoriasis 04/30/2016  . Chronic pain of both knees 04/30/2016  . Pain in both feet 04/30/2016  . Other fatigue 04/30/2016  . Pain in both hands 03/18/2016  . Anxiety 03/18/2016  . Depression 03/18/2016  . Cholelithiasis 03/18/2016  . Iron deficiency anemia 03/18/2016  . Menorrhagia with regular cycle 03/06/2016  . Arm pain, posterior, right 02/24/2016  . BMI 28.0-28.9,adult 04/13/2015    Past Surgical History:  Procedure Laterality Date  . CESAREAN SECTION    . CESAREAN SECTION WITH BILATERAL TUBAL LIGATION N/A 09/21/2012   Procedure: CESAREAN SECTION WITH BILATERAL TUBAL LIGATION;  Surgeon: Woodroe Mode, MD;  Location: [facility];  Service: Obstetrics;  Laterality: N/A;  . CRYOABLATION    . tubal ablation    . WISDOM TOOTH EXTRACTION      OB History    Gravida  3   Para  2   Term  2   Preterm      AB  1   Living  2     SAB  1   TAB      Ectopic      Multiple      Live Births  2            Home Medications    Prior to Admission medications   Medication Sig Start Date End Date Taking? Authorizing Provider  Cetirizine HCl (ZYRTEC PO) Take by mouth.    [provider]  cyclobenzaprine (FLEXERIL) 10 MG tablet Take 1 tablet (10 mg total) by mouth at bedtime. 09/06/18   Fransico Meadow, PA-C  diclofenac (VOLTAREN) 75 MG EC tablet Take 1 tablet (75 mg total) by mouth 2 (two) times daily. 09/06/18   Fransico Meadow, PA-C  ibuprofen (ADVIL,MOTRIN) 200 MG tablet Take 200 mg by mouth every 6 (six) hours as needed.    [provider]  Astrid Drafts Omega-3 300 MG CAPS Take by mouth.    [provider]  Loratadine (CLARITIN PO) Take by mouth.    [provider]  methocarbamol (ROBAXIN) 500 MG tablet Take 1 tablet (500 mg total) by mouth 2 (two) times daily. 09/06/18   Fransico Meadow, PA-C  Turmeric Curcumin 500 MG CAPS Take 500 mg by mouth 2 (two) times daily after a meal.     [provider]  Family History Family History  Problem Relation Age of Onset  . Mitral valve prolapse Mother   . Hyperlipidemia Father   . Depression Father   . Arthritis Father   . Diabetes Maternal Aunt   . Stroke Maternal Aunt   . Diabetes Maternal Uncle   . Cancer Maternal Grandfather        Lung  . Osteoarthritis Maternal Grandfather   . Diabetes Paternal Grandmother   . Osteoarthritis Paternal Grandmother   . Diabetes Maternal Grandmother   . Hypertension Maternal Grandmother   . Osteoarthritis Maternal Grandmother   . Osteoarthritis Paternal Grandfather     Social History Social History   Tobacco Use  . Smoking status: Passive Smoke Exposure - Never Smoker  . Smokeless tobacco: Never Used  Substance Use Topics  . Alcohol use: No  . Drug use: No     Allergies   Patient has no known allergies.   Review of Systems Review of Systems  Gastrointestinal: Negative for abdominal pain.   Musculoskeletal: Positive for back pain.  All other systems reviewed and are negative.    Physical Exam Triage Vital Signs ED Triage Vitals  Enc Vitals Group     BP 09/06/18 1034 119/83     Pulse Rate 09/06/18 1034 82     Resp --      Temp 09/06/18 1034 98.1 F (36.7 C)     Temp src --      SpO2 09/06/18 1034 100 %     Weight --      Height --      Head Circumference --      Peak Flow --      Pain Score 09/06/18 1036 9     Pain Loc --      Pain Edu? --      Excl. in [place]? --    No data found.  Updated Vital Signs BP 119/83   Pulse 82   Temp 98.1 F (36.7 C)   SpO2 100%   Visual Acuity Right Eye Distance:   Left Eye Distance:   Bilateral Distance:    Right Eye Near:   Left Eye Near:    Bilateral Near:     Physical Exam Vitals signs and nursing note reviewed.  Constitutional:      Appearance: She is well-developed.  HENT:     Head: Normocephalic.  Neck:     Musculoskeletal: Normal range of motion.  Cardiovascular:     Rate and Rhythm: Normal rate.  Pulmonary:     Effort: Pulmonary effort is normal.  Abdominal:     General: There is no distension.  Musculoskeletal: Normal range of motion.  Neurological:     Mental Status: She is alert and oriented to person, place, and time.  Psychiatric:        Mood and Affect: Mood normal.      UC Treatments / Results  Labs (all labs ordered are listed, but only abnormal results are displayed) Labs Reviewed  POCT URINALYSIS DIP (DEVICE)    EKG None  Radiology No results found.  Procedures Procedures (including critical care time)  Medications Ordered in UC Medications - No data to display  Initial Impression / Assessment and Plan / UC Course  I have reviewed the triage vital signs and the nursing notes.  Pertinent labs & imaging results that were available during my care of the patient were reviewed by me and considered in my medical decision making (see chart for details).  MDM  Ua is  negative.  I suspect pain is muscular.  Pt given rx for voltaren, robaxin and flexeril for night.  Pt advised to Follow up with her primary care provider for recheck.  Final Clinical Impressions(s) / UC Diagnoses   Final diagnoses:  Acute low back pain without sciatica, unspecified back pain laterality     Discharge Instructions     Return if any problems.    ED Prescriptions    Medication Sig Dispense Auth. Provider   diclofenac (VOLTAREN) 75 MG EC tablet  (Status: Discontinued) Take 1 tablet (75 mg total) by mouth 2 (two) times daily. 30 tablet ,  K, [place]   methocarbamol (ROBAXIN) 500 MG tablet  (Status: Discontinued) Take 1 tablet (500 mg total) by mouth 2 (two) times daily. 20 tablet ,  K, PA-C   cyclobenzaprine (FLEXERIL) 10 MG tablet  (Status: Discontinued) Take 1 tablet (10 mg total) by mouth at bedtime. 10 tablet ,  K, PA-C   cyclobenzaprine (FLEXERIL) 10 MG tablet Take 1 tablet (10 mg total) by mouth at bedtime. 10 tablet ,  K, [place]   diclofenac (VOLTAREN) 75 MG EC tablet Take 1 tablet (75 mg total) by mouth 2 (two) times daily. 30 tablet ,  K, [place]   methocarbamol (ROBAXIN) 500 MG tablet Take 1 tablet (500 mg total) by mouth 2 (two) times daily. 20 tablet Fransico Meadow, [place]     Controlled Substance Prescriptions [place] Controlled Substance Registry consulted? Not Applicable  An After Visit Summary was printed and given to the patient.    Fransico Meadow, [place] 09/06/18 1356

## 2018-09-06 NOTE — Discharge Instructions (Signed)
Return if any problems.

## 2018-09-06 NOTE — ED Triage Notes (Signed)
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

## 2018-10-31 ENCOUNTER — Encounter: Payer: 59 | Admitting: Family Medicine

## 2019-01-24 ENCOUNTER — Encounter: Payer: Self-pay | Admitting: Gynecology
